# Patient Record
Sex: Male | Born: 1935 | Race: White | Hispanic: No | Marital: Married | Smoking: Former smoker
Health system: Southern US, Community
[De-identification: ages and names within clinical notes are randomized; demographics above are authoritative.]

## PROBLEM LIST (undated history)

## (undated) DIAGNOSIS — C801 Malignant (primary) neoplasm, unspecified: Secondary | ICD-10-CM

## (undated) DIAGNOSIS — K219 Gastro-esophageal reflux disease without esophagitis: Secondary | ICD-10-CM

## (undated) DIAGNOSIS — M1990 Unspecified osteoarthritis, unspecified site: Secondary | ICD-10-CM

## (undated) DIAGNOSIS — G629 Polyneuropathy, unspecified: Secondary | ICD-10-CM

## (undated) DIAGNOSIS — K5790 Diverticulosis of intestine, part unspecified, without perforation or abscess without bleeding: Secondary | ICD-10-CM

## (undated) DIAGNOSIS — E785 Hyperlipidemia, unspecified: Secondary | ICD-10-CM

## (undated) DIAGNOSIS — R351 Nocturia: Secondary | ICD-10-CM

## (undated) HISTORY — DX: Gastro-esophageal reflux disease without esophagitis: K21.9

## (undated) HISTORY — DX: Hyperlipidemia, unspecified: E78.5

## (undated) HISTORY — DX: Diverticulosis of intestine, part unspecified, without perforation or abscess without bleeding: K57.90

## (undated) HISTORY — PX: OTHER SURGICAL HISTORY: SHX169

## (undated) HISTORY — DX: Nocturia: R35.1

## (undated) HISTORY — DX: Polyneuropathy, unspecified: G62.9

## (undated) HISTORY — DX: Malignant (primary) neoplasm, unspecified: C80.1

## (undated) HISTORY — DX: Unspecified osteoarthritis, unspecified site: M19.90

---

## 2001-11-03 ENCOUNTER — Encounter: Payer: Self-pay | Admitting: *Deleted

## 2001-11-04 ENCOUNTER — Ambulatory Visit (HOSPITAL_COMMUNITY): Admission: RE | Admit: 2001-11-04 | Discharge: 2001-11-04 | Payer: Self-pay | Admitting: *Deleted

## 2001-11-04 ENCOUNTER — Encounter (INDEPENDENT_AMBULATORY_CARE_PROVIDER_SITE_OTHER): Payer: Self-pay | Admitting: Specialist

## 2001-11-10 ENCOUNTER — Encounter: Admission: RE | Admit: 2001-11-10 | Discharge: 2001-11-10 | Payer: Self-pay | Admitting: *Deleted

## 2001-11-10 ENCOUNTER — Encounter: Payer: Self-pay | Admitting: *Deleted

## 2002-08-28 ENCOUNTER — Ambulatory Visit (HOSPITAL_COMMUNITY): Admission: RE | Admit: 2002-08-28 | Discharge: 2002-08-28 | Payer: Self-pay | Admitting: *Deleted

## 2007-08-18 ENCOUNTER — Encounter: Admission: RE | Admit: 2007-08-18 | Discharge: 2007-08-18 | Payer: Self-pay | Admitting: Family Medicine

## 2008-04-12 ENCOUNTER — Encounter: Admission: RE | Admit: 2008-04-12 | Discharge: 2008-04-12 | Payer: Self-pay | Admitting: Family Medicine

## 2010-07-14 NOTE — Op Note (Signed)
NAMEQUIRINO, KAKOS                         ACCOUNT NO.:  [id number]   MEDICAL RECORD NO.:  [id number]                   PATIENT TYPE:  OIB   LOCATION:  2875                                 FACILITY:  [facility]   PHYSICIAN:  Balinda Quails, M.D.                 DATE OF BIRTH:  08/16/1935   DATE OF PROCEDURE:  11/04/2001  DATE OF DISCHARGE:                                 OPERATIVE REPORT   SURGEON:  Balinda Quails, M.D.   ASSISTANT:  Nurse.   ANESTHESIA:  General with LMA.   ANESTHESIOLOGIST:  Guadalupe Maple, M.D.   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   PROCEDURE:  Stripping ligation left greater saphenous vein.   CLINICAL NOTE:  This is a 75 year old male who has been followed in the  office with left greater saphenous incompetence and large varicose veins. He  recently presented with worsening symptoms of aching and heaviness in his  left leg. Doppler evaluation verified left greater saphenous incompetence,  also bilateral common femoral vein incompetence.   The patient was brought to the operating room at this time for stripping  ligation of the left greater saphenous vein for relief of symptoms.   DESCRIPTION OF PROCEDURE:  The patient brought to the operating room in  stable condition, placed in supine position.   The left thigh prepped and draped in a sterile fashion. General anesthesia  introduced with LMA.   An oblique skin incision made in the left groin over the saphenofemoral  junction. Dissection carried down to expose the saphenofemoral junction.  Tributaries of saphenous vein ligated with 3-0 silk and divided. The  saphenous vein followed up to the saphenofemoral junction and was ligated  with 2-0 silk.   A separate skin incision then made at a premarked site mid calf. Dissection  carried  down to expose the saphenous vein. The saphenous vein ligated  distally with 2-0 silk. The saphenous vein ligated and a stripper passed  along the course of the saphenous vein up to the groin. The saphenous vein  divided proximally. A stripper then used to remove the saphenous vein in  total from the mid calf to the saphenofemoral junction. Bleeding was  controlled with pressure.   A second skin incision was then made over a premarked site in the distal  calf where a large varicosity was present along with a perforator. The  perforator ligated with 2-0 silk. The vein stripped locally and ligated with  2-0 silk.   The incisions were then closed with running 3-0 Vicryl suture for  subcutaneous tissue interrupted 4-0 Vicryl for dermal layers. Half inch  Steri-Strips applied. A dressing of 4 x 4s, Kerlix, and Ace wrap applied.  The patient transferred to the recovery room in stable condition. No  apparent complications.                                                  Balinda Quails, M.D.    PGH/MEDQ  D:  11/04/2001  T:  11/05/2001  Job:  [id number]   cc:   Chales Salmon. Abigail Miyamoto, M.D.

## 2010-10-26 ENCOUNTER — Ambulatory Visit: Payer: Medicare Other | Attending: Family Medicine | Admitting: Physical Therapy

## 2010-10-26 DIAGNOSIS — M25569 Pain in unspecified knee: Secondary | ICD-10-CM | POA: Insufficient documentation

## 2010-10-26 DIAGNOSIS — IMO0001 Reserved for inherently not codable concepts without codable children: Secondary | ICD-10-CM | POA: Insufficient documentation

## 2010-10-26 DIAGNOSIS — M6281 Muscle weakness (generalized): Secondary | ICD-10-CM | POA: Insufficient documentation

## 2010-10-26 DIAGNOSIS — M25559 Pain in unspecified hip: Secondary | ICD-10-CM | POA: Insufficient documentation

## 2010-10-26 DIAGNOSIS — R5381 Other malaise: Secondary | ICD-10-CM | POA: Insufficient documentation

## 2010-11-06 ENCOUNTER — Ambulatory Visit: Payer: Medicare Other | Attending: Family Medicine

## 2010-11-06 DIAGNOSIS — IMO0001 Reserved for inherently not codable concepts without codable children: Secondary | ICD-10-CM | POA: Insufficient documentation

## 2010-11-06 DIAGNOSIS — R5381 Other malaise: Secondary | ICD-10-CM | POA: Insufficient documentation

## 2010-11-06 DIAGNOSIS — M25559 Pain in unspecified hip: Secondary | ICD-10-CM | POA: Insufficient documentation

## 2010-11-06 DIAGNOSIS — M6281 Muscle weakness (generalized): Secondary | ICD-10-CM | POA: Insufficient documentation

## 2010-11-06 DIAGNOSIS — M25569 Pain in unspecified knee: Secondary | ICD-10-CM | POA: Insufficient documentation

## 2010-11-10 ENCOUNTER — Ambulatory Visit: Payer: Medicare Other | Admitting: Physical Therapy

## 2010-11-14 ENCOUNTER — Encounter: Payer: Medicare Other | Admitting: Physical Therapy

## 2010-11-16 ENCOUNTER — Encounter: Payer: Medicare Other | Admitting: Physical Therapy

## 2011-11-12 ENCOUNTER — Other Ambulatory Visit: Payer: Self-pay | Admitting: Dermatology

## 2012-06-02 ENCOUNTER — Other Ambulatory Visit: Payer: Self-pay | Admitting: Dermatology

## 2012-08-27 HISTORY — PX: COLONOSCOPY: SHX174

## 2012-12-24 ENCOUNTER — Other Ambulatory Visit: Payer: Self-pay | Admitting: Gastroenterology

## 2013-01-21 ENCOUNTER — Other Ambulatory Visit: Payer: Self-pay | Admitting: Dermatology

## 2013-02-02 ENCOUNTER — Other Ambulatory Visit: Payer: Self-pay | Admitting: Dermatology

## 2014-03-04 ENCOUNTER — Encounter: Payer: Self-pay | Admitting: *Deleted

## 2014-05-05 ENCOUNTER — Encounter: Payer: Self-pay | Admitting: Podiatrist

## 2014-05-05 ENCOUNTER — Ambulatory Visit (INDEPENDENT_AMBULATORY_CARE_PROVIDER_SITE_OTHER): Payer: Medicare Other

## 2014-05-05 ENCOUNTER — Ambulatory Visit (INDEPENDENT_AMBULATORY_CARE_PROVIDER_SITE_OTHER): Payer: Medicare Other | Admitting: Podiatrist

## 2014-05-05 VITALS — BP 172/85 | HR 99 | Resp 12

## 2014-05-05 DIAGNOSIS — M7741 Metatarsalgia, right foot: Secondary | ICD-10-CM | POA: Diagnosis not present

## 2014-05-05 DIAGNOSIS — R52 Pain, unspecified: Secondary | ICD-10-CM

## 2014-05-05 DIAGNOSIS — M216X9 Other acquired deformities of unspecified foot: Secondary | ICD-10-CM | POA: Diagnosis not present

## 2014-05-05 DIAGNOSIS — M7742 Metatarsalgia, left foot: Secondary | ICD-10-CM

## 2014-05-05 NOTE — Progress Notes (Signed)
   Subjective:    Patient ID: Daryl Mitchell, male    DOB: 1935-02-27, 79 y.o.   MRN: [id number]  HPI  PT STATED B/L TOE ARE ACHING AND LT BALL OF THE FOOT/BUNION ARE HURTING FOR 5 YEARS. FOOT/TOES ARE GETTING WORSE ESPECIALLY LT FOOT IS SWOLLEN. THE FEET/TOES GET AGGRAVATED AT NIGHT/W[place]KNING. TRIED USING INSERTS AND PADDING AND IT HELP.  Review of Systems  HENT: Positive for hearing loss.   Musculoskeletal: Positive for joint swelling.  All other systems reviewed and are negative.      Objective:   Physical Exam  Patient is awake, alert, and oriented x 3.  In no acute distress.  Vascular status is intact with palpable pedal pulses at 2/4 DP and PT bilateral and capillary refill time within normal limits. Neurological sensation is also intact bilaterally via Semmes Weinstein monofilament at 5/5 sites. Light touch, vibratory sensation, Achilles tendon reflex is intact. Dermatological exam reveals skin color, turger and texture as normal. No open lesions present.  Musculature intact with dorsiflexion, plantarflexion, inversion, eversion.  Elongated 2nd digits bilateral are present and mildly bothersome.  Pain at the second metatarsal head plantarly is palpated.  Slight lateral deviation of bilateral halluces is noted.  Dorsal spurring of the first metatarsal head is noted mildly bilateral as well.  Crowding of the lesser digits is noted bilateral.    xrays show short first metattarsal with elongation of the second metatarsal and digit.  Lateral deviation of hallux noted bilateral. No sigh of acute boney abnormality noted..     Assessment & Plan:  Prominent 2nd metatarsal head with pain, metatarsalgia  Plan:  Discussed conservative therapies consisting of custom orthotics versus surgical options.  Patient will consider his options and let me know if he would like to proceed with a custom orthotic.

## 2014-05-05 NOTE — Patient Instructions (Signed)
Arthritis, Nonspecific °Arthritis is pain, redness, warmth, or puffiness (inflammation) of a joint. The joint may be stiff or hurt when you move it. One or more joints may be affected. There are many types of arthritis. Your doctor may not know what type you have right away. The most common cause of arthritis is wear and tear on the joint (osteoarthritis). °HOME CARE  °· Only take medicine as told by your doctor. °· Rest the joint as much as possible. °· Raise (elevate) your joint if it is puffy. °· Use crutches if the painful joint is in your leg. °· Drink enough fluids to keep your pee (urine) clear or pale yellow. °· Follow your doctor's diet instructions. °· Use cold packs for very bad joint pain for 10 to 15 minutes every hour. Ask your doctor if it is okay for you to use hot packs. °· Exercise as told by your doctor. °· Take a warm shower if you have stiffness in the morning. °· Move your sore joints throughout the day. °GET HELP RIGHT AWAY IF:  °· You have a fever. °· You have very bad joint pain, puffiness, or redness. °· You have many joints that are painful and puffy. °· You are not getting better with treatment. °· You have very bad back pain or leg weakness. °· You cannot control when you poop (bowel movement) or pee (urinate). °· You do not feel better in 24 hours or are getting worse. °· You are having side effects from your medicine. °MAKE SURE YOU:  °· Understand these instructions. °· Will watch your condition. °· Will get help right away if you are not doing well or get worse. °Document Released: 05/09/2009 Document Revised: 08/14/2011 Document Reviewed: 05/09/2009 °ExitCare® Patient Information ©2015 [employer]. This information is not intended to replace advice given to you by your health care provider. Make sure you discuss any questions you have with your health care provider. ° °

## 2015-03-14 ENCOUNTER — Ambulatory Visit (INDEPENDENT_AMBULATORY_CARE_PROVIDER_SITE_OTHER): Payer: Medicare Other | Admitting: Podiatry

## 2015-03-14 ENCOUNTER — Encounter: Payer: Self-pay | Admitting: Podiatry

## 2015-03-14 ENCOUNTER — Ambulatory Visit (INDEPENDENT_AMBULATORY_CARE_PROVIDER_SITE_OTHER): Payer: Medicare Other

## 2015-03-14 VITALS — Resp 16

## 2015-03-14 DIAGNOSIS — M79672 Pain in left foot: Secondary | ICD-10-CM

## 2015-03-14 DIAGNOSIS — M25571 Pain in right ankle and joints of right foot: Secondary | ICD-10-CM | POA: Diagnosis not present

## 2015-03-14 DIAGNOSIS — M779 Enthesopathy, unspecified: Secondary | ICD-10-CM | POA: Diagnosis not present

## 2015-03-14 MED ORDER — TRIAMCINOLONE ACETONIDE 10 MG/ML IJ SUSP
10.0000 mg | Freq: Once | INTRAMUSCULAR | Status: AC
  Administered 2015-03-14: 10 mg

## 2015-03-14 NOTE — Progress Notes (Signed)
Subjective:     Patient ID: Daryl Mitchell, male   DOB: 1935-10-30, 80 y.o.   MRN: [id number]  HPI patient presents stating I have had orthotics for a long time and I been trying to make modifications in them but it doesn't seem to be as effective and my ankle has really been bothering me my right foot   Review of Systems  All other systems reviewed and are negative.      Objective:   Physical Exam  Constitutional: He is oriented to person, place, and time.  Cardiovascular: Intact distal pulses.   Musculoskeletal: Normal range of motion.  Neurological: He is oriented to person, place, and time.  Skin: Skin is warm.  Nursing note and vitals reviewed.  neurovascular status intact muscle strength adequate range of motion within normal limits with patient noted to have inflammatory changes around the ankle joint right with pain in the sinus tarsi and mild mid arch pain right over left. Also noted limb length discrepancy with the right leg being approximately 1/4 inch shorter than the left and good digital perfusion and well oriented patient is noted     Assessment:     Inflammatory capsulitis sinus tarsi right with fluid buildup and mild fasciitis symptoms right with mechanical dysfunction    Plan:     H&P x-rays reviewed and today I injected the sinus tarsi right 3 mg Kenalog 5 mg Xylocaine and went ahead and scanned for custom orthotics with lift of 3/16  inch on the right side. Patient be seen back to recheck

## 2015-04-07 ENCOUNTER — Ambulatory Visit: Payer: Medicare Other | Admitting: *Deleted

## 2015-04-07 DIAGNOSIS — M779 Enthesopathy, unspecified: Secondary | ICD-10-CM

## 2015-04-07 NOTE — Patient Instructions (Signed)

## 2015-04-07 NOTE — Progress Notes (Signed)
Patient ID: Daryl Mitchell, male   DOB: [birth year], 80 y.o.   MRN: [id number] Patient presents for orthotic pick up.  Verbal and written break in and wear instructions given.  Patient will follow up in 4 weeks if symptoms worsen or fail to improve.

## 2015-04-15 ENCOUNTER — Ambulatory Visit: Payer: Medicare Other | Admitting: *Deleted

## 2015-04-15 DIAGNOSIS — M779 Enthesopathy, unspecified: Secondary | ICD-10-CM

## 2015-04-15 NOTE — Progress Notes (Signed)
Patient ID: Daryl Mitchell, male   DOB: December 24, 1935, 80 y.o.   MRN: [id number] Patient presents for orthotic adjustment. Patient states that it feels like his heel is "dropping off".  Patient pulled out store bought orthotics he had adjusted himself with stick on felt pads.  The adjustment the patient is requesting is an extreme change I am not comfortable making.  Reappoint with Dr Paulla Dolly for further discussion of appropriate adjustments.

## 2015-04-21 ENCOUNTER — Ambulatory Visit (INDEPENDENT_AMBULATORY_CARE_PROVIDER_SITE_OTHER): Payer: Medicare Other | Admitting: Podiatry

## 2015-04-21 ENCOUNTER — Encounter: Payer: Self-pay | Admitting: Podiatry

## 2015-04-21 DIAGNOSIS — M779 Enthesopathy, unspecified: Secondary | ICD-10-CM

## 2015-04-21 NOTE — Progress Notes (Signed)
Subjective:     Patient ID: Daryl Mitchell, male   DOB: Sep 28, 1935, 80 y.o.   MRN: [id number]  HPI patient presents stating that he wanted his orthotics looked at   Review of Systems     Objective:   Physical Exam Neurovascular status intact no other health history changes noted    Assessment:    Orthotics that need more support    Plan:     Send her orthotics back for redo

## 2015-05-09 ENCOUNTER — Ambulatory Visit: Payer: Medicare Other | Admitting: *Deleted

## 2015-05-09 DIAGNOSIS — R52 Pain, unspecified: Secondary | ICD-10-CM

## 2015-05-09 NOTE — Progress Notes (Signed)
Patient ID: Daryl Mitchell, male   DOB: June 13, 1935, 80 y.o.   MRN: [id number] Patient presents for adjusted orthotic pick up.  Verbal and written break in and wear instructions given.  Patient will follow up in 4 weeks if symptoms worsen or fail to improve.

## 2015-05-09 NOTE — Patient Instructions (Signed)

## 2015-05-24 ENCOUNTER — Ambulatory Visit: Payer: Medicare Other | Admitting: *Deleted

## 2015-05-24 DIAGNOSIS — R52 Pain, unspecified: Secondary | ICD-10-CM

## 2015-05-25 NOTE — Progress Notes (Signed)
Patient ID: Daryl Mitchell, male   DOB: 15-Apr-1935, 80 y.o.   MRN: [id number] Patient presents for continued adjustment on orthotics. Shaved additional height off of both wedges to increase comfort.

## 2019-01-16 ENCOUNTER — Other Ambulatory Visit: Payer: Self-pay

## 2019-01-16 ENCOUNTER — Encounter: Payer: Self-pay | Admitting: Physical Therapy

## 2019-01-16 ENCOUNTER — Ambulatory Visit: Payer: Medicare Other | Attending: Orthopedic Surgery | Admitting: Physical Therapy

## 2019-01-16 DIAGNOSIS — M545 Low back pain, unspecified: Secondary | ICD-10-CM

## 2019-01-16 DIAGNOSIS — R293 Abnormal posture: Secondary | ICD-10-CM | POA: Insufficient documentation

## 2019-01-16 DIAGNOSIS — M6281 Muscle weakness (generalized): Secondary | ICD-10-CM | POA: Diagnosis present

## 2019-01-16 NOTE — Therapy (Signed)
[facility] [address] [place], [place], [address] Phone: [phone]   Fax:  [phone]  Physical Therapy Evaluation  Patient Details  Name: Daryl Mitchell MRN: [id number] Date of Birth: Jul 31, 1935 Referring Provider (PT): Dr. Lajuana Matte   Encounter Date: 01/16/2019  PT End of Session - 01/16/19 0923    Visit Number  1    Date for PT Re-Evaluation  03/13/19    PT Start Time  0845    PT Stop Time  0923    PT Time Calculation (min)  38 min    Activity Tolerance  Patient tolerated treatment well;No increased pain    Behavior During Therapy  WFL for tasks assessed/performed       Past Medical History:  Diagnosis Date  . Arthritis   . Cancer ([place])    Basal and squamous cell carcinoma, Dr.Gruber  . Diverticulosis   . GERD (gastroesophageal reflux disease)   . Hyperlipidemia   . Nocturia   . Peripheral neuropathy     Past Surgical History:  Procedure Laterality Date  . COLONOSCOPY  08/27/2012  . skin cancer removal      There were no vitals filed for this visit.   Subjective Assessment - 01/16/19 0851    Subjective  3 months ago his back started to bother him. The pain wakes him up in the middle of the night. Patient has been doing walking and stretching 6 times per week.    Patient Stated Goals  reduce pain to sleep through the night    Currently in Pain?  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 Lajuana Matte    Prior Therapy  none      Precautions    Precautions  None      Restrictions   Weight Bearing Restrictions  No      Balance Screen   Has the patient fallen in the past 6 months  No    Has the patient had a decrease in activity level because of a fear of falling?   No    Is the patient reluctant to leave their home because of a fear of falling?   No      Home Film/video editor residence      Prior Function   Level of Independence  Independent    Vocation  Retired    Leisure  walks and stretches      Cognition   Overall Cognitive Status  Within Functional Limits for tasks assessed      Observation/Other Assessments   Focus on Therapeutic Outcomes (FOTO)   21% limitation; goal is 20% limitation      Posture/Postural Control   Posture/Postural Control  Postural limitations    Postural Limitations  Increased thoracic  kyphosis;Decreased lumbar lordosis;Posterior pelvic tilt      ROM / Strength   AROM / PROM / Strength  AROM;PROM;Strength      AROM   Lumbar Flexion  full    Lumbar Extension  decreased by 50%    Lumbar - Right Side Bend  decreased by 25%    Lumbar - Left Side Bend  decreased by 25%    Lumbar - Right Rotation  decreased 50%    Lumbar - Left Rotation  decreased by 50%      PROM   Right Hip External Rotation   40    Right Hip Internal Rotation   15    Left Hip External Rotation   35    Left Hip Internal Rotation   35      Strength   Right Hip Extension  4/5    Right Hip ABduction  3+/5    Left Hip Extension  4/5    Left Hip ABduction  3+/5      Palpation   Palpation comment  decreased movement of T3-L5                Objective measurements completed on examination: See above findings.              PT Education - 01/16/19 0922    Education Details  Access Code: [email or web address]    Person(s) Educated  Patient    Methods  Explanation;Demonstration;Verbal cues;Handout    Comprehension  Verbalized understanding;Returned demonstration       PT Short Term  Goals - 01/16/19 0928      PT SHORT TERM GOAL #1   Title  independent with initial HEP    Time  4    Period  Weeks    Status  New    Target Date  02/13/19      PT SHORT TERM GOAL #2   Title  able to sleep with pain decreased >/= 25%    Time  4    Period  Weeks    Status  New    Target Date  02/13/19      PT SHORT TERM GOAL #3   Title  understand ways to sleep to decrease strain on the spine    Time  4    Period  Weeks    Status  New    Target Date  02/13/19      PT SHORT TERM GOAL #4   Title  understand how to correct his posture to decrease strain on the spine    Time  4    Period  Weeks    Status  New    Target Date  02/13/19        PT Long Term Goals - 01/16/19 0929      PT LONG TERM GOAL #1   Title  independent with advanced HEP    Time  8    Period  Weeks    Status  New    Target Date  03/13/19      PT LONG TERM GOAL #2   Title  able to sleep through the night without pain    Time  8    Period  Weeks    Status  New    Target Date  03/13/19      PT LONG TERM GOAL #3   Title  able to walk with his normal speed and with minimal to no back discomfort    Time  8  Period  Weeks    Status  New    Target Date  03/13/19      PT LONG TERM GOAL #4   Title  FOTO score </= 20% limitation    Time  8    Period  Weeks    Status  New    Target Date  03/13/19             Plan - 01/16/19 [id number]    Clinical Impression Statement  Patient is a 83 year old male with lumbar pain when he sleeps at level 4/10 and started 4 months ago. Patient sleeps on his side and will wake up in the middle of the night due to pain. Patient hip strength for abduction and extension decreased. Patient has decreased lumbar ROM. Patient has difficulty with extending his lumbar spine. Patient posture consists of forward head, increased thoracic kyphosis, flat lumbar spine and posteriorly tilted spine. Patient will benefit from skilled therapy to reduce lumbar pain and improve posture.     Personal Factors and Comorbidities  Age    Examination-Activity Limitations  Locomotion Level;Sleep    Examination-Participation Restrictions  Community Activity    Stability/Clinical Decision Making  Stable/Uncomplicated    Clinical Decision Making  Low    Rehab Potential  Excellent    PT Frequency  1x / week    PT Duration  8 weeks    PT Treatment/Interventions  Cryotherapy;Electrical Stimulation;Moist Heat;Traction;Therapeutic exercise;Therapeutic activities;Functional mobility training;Neuromuscular re-education;Patient/family education;Dry needling;Manual techniques;Spinal Manipulations    PT Next Visit Plan  spinal mobilization to improve thoracic and lumbar extension, spinal stretches, elongation of the abdominals, back strength    PT Home Exercise Plan  Access Code: [email or web address]    Consulted and Agree with Plan of Care  Patient       Patient will benefit from skilled therapeutic intervention in order to improve the following deficits and impairments:  Decreased range of motion, Difficulty walking, Increased fascial restricitons, Pain, Impaired flexibility, Decreased strength, Postural dysfunction  Visit Diagnosis: Acute bilateral low back pain without sciatica  Muscle weakness (generalized)  Abnormal posture     Problem List There are no active problems to display for this patient.   Earlie Counts, PT 01/16/19 9:34 AM   [facility] [address], [place], [place], [address] Phone: [phone]   Fax:  [phone]  Name: Daryl Mitchell MRN: [id number] Date of Birth: 1936-02-23

## 2019-01-16 NOTE — Patient Instructions (Signed)
Access Code: [email or web address]  URL: [email or web address]  Date: 01/16/2019  Prepared by: Earlie Counts   Exercises Prone Press Up - 5 reps - 1 sets - 2 hold - 1x daily - 7x weekly Correct Seated Posture - 1 reps - 1 sets - 5 sec hold - 1x daily - 7x weekly Correct Standing Posture - 1 reps - 1 sets - 5 sec hold - 1x daily - 7x weekly Patient Education Forward [facility] [address] [place], [place] [address] Phone # [phone] Fax [phone]

## 2019-01-20 ENCOUNTER — Other Ambulatory Visit: Payer: Self-pay

## 2019-01-20 ENCOUNTER — Ambulatory Visit: Payer: Medicare Other | Admitting: Physical Therapy

## 2019-01-20 ENCOUNTER — Encounter: Payer: Self-pay | Admitting: Physical Therapy

## 2019-01-20 DIAGNOSIS — M545 Low back pain, unspecified: Secondary | ICD-10-CM

## 2019-01-20 DIAGNOSIS — R293 Abnormal posture: Secondary | ICD-10-CM

## 2019-01-20 DIAGNOSIS — M6281 Muscle weakness (generalized): Secondary | ICD-10-CM

## 2019-01-20 NOTE — Therapy (Signed)
[facility] [address] [place], [place], [address] Phone: [phone]   Fax:  [phone]  Physical Therapy Treatment  Patient Details  Name: JOURDON LAFLASH MRN: [id number] Date of Birth: February 22, 1936 Referring Provider (PT): Dr. Lajuana Matte   Encounter Date: 01/20/2019  PT End of Session - 01/20/19 1344    Visit Number  2    Date for PT Re-Evaluation  03/13/19    PT Start Time  1232    PT Stop Time  1314    PT Time Calculation (min)  42 min    Activity Tolerance  Patient tolerated treatment well;No increased pain    Behavior During Therapy  WFL for tasks assessed/performed       Past Medical History:  Diagnosis Date  . Arthritis   . Cancer ([place])    Basal and squamous cell carcinoma, Dr.Gruber  . Diverticulosis   . GERD (gastroesophageal reflux disease)   . Hyperlipidemia   . Nocturia   . Peripheral neuropathy     Past Surgical History:  Procedure Laterality Date  . COLONOSCOPY  08/27/2012  . skin cancer removal      There were no vitals filed for this visit.  Subjective Assessment - 01/20/19 1235    Subjective  Pt states that things are going well. No pain currently. He did have some pain last night while sleeping.    Patient Stated Goals  reduce pain to sleep through the night    Currently in Pain?  No/denies    Pain Onset  More than a month ago                       [facility] Adult PT Treatment/Exercise - 01/20/19 0001      Exercises   Exercises  Lumbar      Lumbar Exercises: Stretches   Piriformis Stretch  2 reps;Left;Right;30 seconds    Piriformis Stretch Limitations  seated       Lumbar Exercises: Aerobic   Nustep  L1 x4 min, PT present to discuss progress and HEP      Lumbar Exercises: Seated   Other Seated Lumbar Exercises  upper lumbar extension over half foam roll x5 reps upward to thoracic spine, PT assisting extension x5 reps in mid thoracic spine       Lumbar  Exercises: Supine   Other Supine Lumbar Exercises  hooklying low trunk rotation x10 reps each      Manual Therapy   Manual Therapy  Passive ROM    Passive ROM  Active assisted stretch with supine low trunk rotation x5 reps each, seated thoracic rotation mobilization with movement x10 reps Lt and Rt              PT Education - 01/20/19 1344    Education Details  updated HEP    Person(s) Educated  Patient    Methods  Explanation;Handout    Comprehension  Verbalized understanding       PT Short Term Goals - 01/16/19 0928      PT SHORT TERM GOAL #1   Title  independent with initial HEP    Time  4    Period  Weeks    Status  New    Target Date  02/13/19      PT SHORT TERM GOAL #2   Title  able to sleep with pain decreased >/= 25%    Time  4    Period  Weeks  Status  New    Target Date  02/13/19      PT SHORT TERM GOAL #3   Title  understand ways to sleep to decrease strain on the spine    Time  4    Period  Weeks    Status  New    Target Date  02/13/19      PT SHORT TERM GOAL #4   Title  understand how to correct his posture to decrease strain on the spine    Time  4    Period  Weeks    Status  New    Target Date  02/13/19        PT Long Term Goals - 01/16/19 0929      PT LONG TERM GOAL #1   Title  independent with advanced HEP    Time  8    Period  Weeks    Status  New    Target Date  03/13/19      PT LONG TERM GOAL #2   Title  able to sleep through the night without pain    Time  8    Period  Weeks    Status  New    Target Date  03/13/19      PT LONG TERM GOAL #3   Title  able to walk with his normal speed and with minimal to no back discomfort    Time  8    Period  Weeks    Status  New    Target Date  03/13/19      PT LONG TERM GOAL #4   Title  FOTO score </= 20% limitation    Time  8    Period  Weeks    Status  New    Target Date  03/13/19            Plan - 01/20/19 1345    Clinical Impression Statement  Pt has had no  issues with his HEP since the evaluation. Today focused on therex and manual techniques to improve both thoracic and lumbar mobility. Pt requires therapist cuing to improve his understanding of several new exercises, but he denied any pain with these. Pt's posture awareness has greatly improved from his evaluation, but he would continue to benefit from skilled PT to address mobility and strength deficits to increase his ability to self-correct posture moving forward.    Personal Factors and Comorbidities  Age    Examination-Activity Limitations  Locomotion Level;Sleep    Examination-Participation Restrictions  Community Activity    Stability/Clinical Decision Making  Stable/Uncomplicated    Rehab Potential  Excellent    PT Frequency  1x / week    PT Duration  8 weeks    PT Treatment/Interventions  Cryotherapy;Electrical Stimulation;Moist Heat;Traction;Therapeutic exercise;Therapeutic activities;Functional mobility training;Neuromuscular re-education;Patient/family education;Dry needling;Manual techniques;Spinal Manipulations    PT Next Visit Plan  spinal mobilization to improve thoracic and lumbar extension, spinal stretches, elongation of the abdominals, back strength    PT Home Exercise Plan  Access Code: [email or web address]    Consulted and Agree with Plan of Care  Patient       Patient will benefit from skilled therapeutic intervention in order to improve the following deficits and impairments:  Decreased range of motion, Difficulty walking, Increased fascial restricitons, Pain, Impaired flexibility, Decreased strength, Postural dysfunction  Visit Diagnosis: Acute bilateral low back pain without sciatica  Muscle weakness (generalized)  Abnormal posture     Problem List There are no active problems  to display for this patient.   2:00 PM,01/20/19 Juniper Cobey PT, [place] [facility] [place]  [facility] [address], [place], [place], [address] Phone: [phone]   Fax:  [phone]  Name: KESLEY KEGG MRN: [id number] Date of Birth: Jul 01, 1935

## 2019-01-20 NOTE — Patient Instructions (Signed)
Access Code: [email or web address]  URL: [email or web address]  Date: 01/20/2019  Prepared by: Sherol Dade   Exercises  Prone Press Up - 5 reps - 1 sets - 2 hold - 1x daily - 7x weekly  Correct Seated Posture - 1 reps - 1 sets - 5 sec hold - 1x daily - 7x weekly  Correct Standing Posture - 1 reps - 1 sets - 5 sec hold - 1x daily - 7x weekly  Supine Lower Trunk Rotation - 15 reps - 2x daily - 7x weekly  Seated Thoracic Lumbar Extension - 10 reps - 3 sets - 1x daily - 7x weekly  Patient Education  [facility] [address] [place], [place] [address] Phone # [phone] Fax [phone]

## 2019-01-29 ENCOUNTER — Ambulatory Visit: Payer: Medicare Other | Attending: Orthopedic Surgery | Admitting: Physical Therapy

## 2019-01-29 ENCOUNTER — Encounter: Payer: Self-pay | Admitting: Physical Therapy

## 2019-01-29 ENCOUNTER — Other Ambulatory Visit: Payer: Self-pay

## 2019-01-29 DIAGNOSIS — M545 Low back pain, unspecified: Secondary | ICD-10-CM

## 2019-01-29 DIAGNOSIS — M6281 Muscle weakness (generalized): Secondary | ICD-10-CM | POA: Diagnosis present

## 2019-01-29 DIAGNOSIS — R293 Abnormal posture: Secondary | ICD-10-CM | POA: Insufficient documentation

## 2019-01-29 NOTE — Patient Instructions (Signed)
Access Code: [email or web address]  URL: [email or web address]  Date: 01/29/2019  Prepared by: Sherol Dade   Exercises  Prone Press Up - 5 reps - 1 sets - 2 hold - 1x daily - 7x weekly  Correct Seated Posture - 1 reps - 1 sets - 5 sec hold - 1x daily - 7x weekly  Correct Standing Posture - 1 reps - 1 sets - 5 sec hold - 1x daily - 7x weekly  Supine Lower Trunk Rotation - 15 reps - 2x daily - 7x weekly  Seated Thoracic Lumbar Extension - 10 reps - 3 sets - 2x daily - 7x weekly  Standing Sidebends - 10 reps - 3 sec hold - 2x daily - 7x weekly  Patient Education  [facility] [address], [place], [place] [address] Phone # [phone] Fax [phone]

## 2019-01-29 NOTE — Therapy (Signed)
[facility] [address] [place], [place], [address] Phone: [phone]   Fax:  [phone]  Physical Therapy Treatment  Patient Details  Name: Daryl Mitchell MRN: [id number] Date of Birth: 30-Jan-1936 Referring Provider (PT): Dr. Lajuana Matte   Encounter Date: 01/29/2019  PT End of Session - 01/29/19 1234    Visit Number  3    Date for PT Re-Evaluation  03/13/19    PT Start Time  1232    PT Stop Time  1311    PT Time Calculation (min)  39 min    Activity Tolerance  Patient tolerated treatment well;No increased pain    Behavior During Therapy  WFL for tasks assessed/performed       Past Medical History:  Diagnosis Date  . Arthritis   . Cancer ([place])    Basal and squamous cell carcinoma, Dr.Gruber  . Diverticulosis   . GERD (gastroesophageal reflux disease)   . Hyperlipidemia   . Nocturia   . Peripheral neuropathy     Past Surgical History:  Procedure Laterality Date  . COLONOSCOPY  08/27/2012  . skin cancer removal      There were no vitals filed for this visit.  Subjective Assessment - 01/29/19 1233    Subjective  Pt states that he is still having pain in the middle of the night. He is waking up at around 5am and having trouble getting back to sleep.    Patient Stated Goals  reduce pain to sleep through the night    Currently in Pain?  No/denies    Pain Onset  More than a month ago                       [facility] Adult PT Treatment/Exercise - 01/29/19 0001      Lumbar Exercises: Stretches   Sports administrator  Left;Right;2 reps;30 seconds    Quad Stretch Limitations  standing with LE in chair     Piriformis Stretch  1 rep;Left;Right;30 seconds    Piriformis Stretch Limitations  seated    Other Lumbar Stretch Exercise  chest stretch in doorway 2x20 sec hold       Lumbar Exercises: Seated   Other Seated Lumbar Exercises  lateral lumbar flexion with UE reach x10 reps each side    Other  Seated Lumbar Exercises  low thoracic to mid lumbar spine extension with strap x10 reps each       Lumbar Exercises: Supine   Other Supine Lumbar Exercises  bilateral knees to chest and trunk rotation x10 reps each, LE on red physioball       Manual Therapy   Manual therapy comments  seated mid thoracic extension MWM x10 reps each segment; CPAs grade III-IV x2 bouts L3 to T11             PT Education - 01/29/19 1248    Education Details  techinque with therex; updates to HEP    Person(s) Educated  Patient    Methods  Explanation;Handout;Verbal cues    Comprehension  Verbalized understanding;Returned demonstration       PT Short Term Goals - 01/16/19 0928      PT SHORT TERM GOAL #1   Title  independent with initial HEP    Time  4    Period  Weeks    Status  New    Target Date  02/13/19      PT SHORT TERM GOAL #2  Title  able to sleep with pain decreased >/= 25%    Time  4    Period  Weeks    Status  New    Target Date  02/13/19      PT SHORT TERM GOAL #3   Title  understand ways to sleep to decrease strain on the spine    Time  4    Period  Weeks    Status  New    Target Date  02/13/19      PT SHORT TERM GOAL #4   Title  understand how to correct his posture to decrease strain on the spine    Time  4    Period  Weeks    Status  New    Target Date  02/13/19        PT Long Term Goals - 01/16/19 0929      PT LONG TERM GOAL #1   Title  independent with advanced HEP    Time  8    Period  Weeks    Status  New    Target Date  03/13/19      PT LONG TERM GOAL #2   Title  able to sleep through the night without pain    Time  8    Period  Weeks    Status  New    Target Date  03/13/19      PT LONG TERM GOAL #3   Title  able to walk with his normal speed and with minimal to no back discomfort    Time  8    Period  Weeks    Status  New    Target Date  03/13/19      PT LONG TERM GOAL #4   Title  FOTO score </= 20% limitation    Time  8    Period   Weeks    Status  New    Target Date  03/13/19            Plan - 01/29/19 1312    Clinical Impression Statement  Pt is working on his HEP and feels that his mobility is improving throughout the day. Session focused on therex and manual techniques to increase lumbar and thoracic mobility. Several additional stretches were introduced, and pt required PT cuing to improve his understanding/technique with these. No pain was reported during or following today's session. Will continue to address spinal segment hypomobility and flexibility moving forward.    Personal Factors and Comorbidities  Age    Examination-Activity Limitations  Locomotion Level;Sleep    Examination-Participation Restrictions  Community Activity    Stability/Clinical Decision Making  Stable/Uncomplicated    Rehab Potential  Excellent    PT Frequency  1x / week    PT Duration  8 weeks    PT Treatment/Interventions  Cryotherapy;Electrical Stimulation;Moist Heat;Traction;Therapeutic exercise;Therapeutic activities;Functional mobility training;Neuromuscular re-education;Patient/family education;Dry needling;Manual techniques;Spinal Manipulations    PT Next Visit Plan  spinal mobilization to improve thoracic and lumbar extension, spinal stretches, elongation of the abdominals, back strength    PT Home Exercise Plan  Access Code: [email or web address]    Consulted and Agree with Plan of Care  Patient       Patient will benefit from skilled therapeutic intervention in order to improve the following deficits and impairments:  Decreased range of motion, Difficulty walking, Increased fascial restricitons, Pain, Impaired flexibility, Decreased strength, Postural dysfunction  Visit Diagnosis: Acute bilateral low back pain without sciatica  Muscle weakness (generalized)  Abnormal posture     Problem List There are no active problems to display for this patient.   1:18 PM,01/29/19 Sherol Dade PT, [place] [facility] [place]  [facility] [address] [place], [place], [address] Phone: [phone]   Fax:  [phone]  Name: Daryl Mitchell MRN: [id number] Date of Birth: 11-24-1935

## 2019-02-05 ENCOUNTER — Ambulatory Visit: Payer: Medicare Other | Admitting: Physical Therapy

## 2019-02-05 ENCOUNTER — Other Ambulatory Visit: Payer: Self-pay

## 2019-02-05 ENCOUNTER — Encounter: Payer: Self-pay | Admitting: Physical Therapy

## 2019-02-05 DIAGNOSIS — M545 Low back pain, unspecified: Secondary | ICD-10-CM

## 2019-02-05 DIAGNOSIS — M6281 Muscle weakness (generalized): Secondary | ICD-10-CM

## 2019-02-05 DIAGNOSIS — R293 Abnormal posture: Secondary | ICD-10-CM

## 2019-02-05 NOTE — Therapy (Signed)
[facility] [address] [place], [place], [address] Phone: [phone]   Fax:  [phone]  Physical Therapy Treatment  Patient Details  Name: Daryl Mitchell MRN: [id number] Date of Birth: [birth year] Referring Provider (PT): Dr. Lajuana Matte   Encounter Date: 02/05/2019  PT End of Session - 02/05/19 1447    Visit Number  4    Date for PT Re-Evaluation  03/13/19    PT Start Time  1400    PT Stop Time  5885    PT Time Calculation (min)  39 min    Activity Tolerance  Patient tolerated treatment well;No increased pain    Behavior During Therapy  WFL for tasks assessed/performed       Past Medical History:  Diagnosis Date  . Arthritis   . Cancer ([place])    Basal and squamous cell carcinoma, Dr.Gruber  . Diverticulosis   . GERD (gastroesophageal reflux disease)   . Hyperlipidemia   . Nocturia   . Peripheral neuropathy     Past Surgical History:  Procedure Laterality Date  . COLONOSCOPY  08/27/2012  . skin cancer removal      There were no vitals filed for this visit.  Subjective Assessment - 02/05/19 1405    Subjective  Pt states that things are going well. He is still waking up with back pain but it is not nearly as bad, nearly 20% improved.    Patient Stated Goals  reduce pain to sleep through the night    Currently in Pain?  No/denies    Pain Onset  More than a month ago                       [facility] Adult PT Treatment/Exercise - 02/05/19 0001      Self-Care   Self-Care  Posture    Posture  sleeping positions with pillow between knees; discussed ways to complete HEP in bed if he wakes up with pain and attempting to go back to sleep rather than getting up       Lumbar Exercises: Stretches   Other Lumbar Stretch Exercise  hip adductor butterfly stretch 5x10 sec hold     Other Lumbar Stretch Exercise  pec stretch in doorway x20sec; Lt and Rt latissimus stretch in doorway 2x15sec each        Lumbar Exercises: Seated   Other Seated Lumbar Exercises  lumbar extension over foam with BUE reach overhead 2x15 reps     Other Seated Lumbar Exercises  thoracic rotation stretch x10 reps each direction       Lumbar Exercises: Sidelying   Clam  Both;10 reps    Clam Limitations  1st set without resistance, 2nd set with yellow TB      Lumbar Exercises: Prone   Other Prone Lumbar Exercises  press up on elbows       Manual Therapy   Manual therapy comments  Lateral trunk flexion stretch with PT overpressure and blocking pelvis x10 reps each     Passive ROM  prone quadriceps stretch 3x20 sec each; supine 90/90 hamstring stretch 4x20 sec each              PT Education - 02/05/19 1446    Education Details  self care; technique with therex    Person(s) Educated  Patient    Methods  Explanation;Verbal cues    Comprehension  Verbalized understanding;Returned demonstration       PT Short Term  Goals - 02/05/19 1422      PT SHORT TERM GOAL #1   Title  independent with initial HEP    Time  4    Period  Weeks    Status  Achieved    Target Date  02/13/19      PT SHORT TERM GOAL #2   Title  able to sleep with pain decreased >/= 25%    Baseline  20%    Time  4    Period  Weeks    Status  Partially Met    Target Date  02/13/19      PT SHORT TERM GOAL #3   Title  understand ways to sleep to decrease strain on the spine    Time  4    Period  Weeks    Status  Achieved    Target Date  02/13/19      PT SHORT TERM GOAL #4   Title  understand how to correct his posture to decrease strain on the spine    Time  4    Period  Weeks    Status  New    Target Date  02/13/19        PT Long Term Goals - 01/16/19 0929      PT LONG TERM GOAL #1   Title  independent with advanced HEP    Time  8    Period  Weeks    Status  New    Target Date  03/13/19      PT LONG TERM GOAL #2   Title  able to sleep through the night without pain    Time  8    Period  Weeks    Status   New    Target Date  03/13/19      PT LONG TERM GOAL #3   Title  able to walk with his normal speed and with minimal to no back discomfort    Time  8    Period  Weeks    Status  New    Target Date  03/13/19      PT LONG TERM GOAL #4   Title  FOTO score </= 20% limitation    Time  8    Period  Weeks    Status  New    Target Date  03/13/19            Plan - 02/05/19 1443    Clinical Impression Statement  Pt is making progress towards his goals, noting atleast 20% improvement in his pain when sleeping at night. Continues to complete his HEP consistently without reported difficulty. PT discussed ways to incorporate his HEP during the night when sleep is disturbed by pain and educated him on sleeping position adjustments moving forward. Pt verbalized understanding of this. No pain was reported end of session. Will continue with current POC and follow up with pt's sleeping over the weekend after he makes the adjustments reviewed during today's session.    Personal Factors and Comorbidities  Age    Examination-Activity Limitations  Locomotion Level;Sleep    Examination-Participation Restrictions  Community Activity    Stability/Clinical Decision Making  Stable/Uncomplicated    Rehab Potential  Excellent    PT Frequency  1x / week    PT Duration  8 weeks    PT Treatment/Interventions  Cryotherapy;Electrical Stimulation;Moist Heat;Traction;Therapeutic exercise;Therapeutic activities;Functional mobility training;Neuromuscular re-education;Patient/family education;Dry needling;Manual techniques;Spinal Manipulations    PT Next Visit Plan  f/u on sleeping; spinal mobilization  to improve thoracic and lumbar extension, spinal stretches, elongation of the abdominals, back strength    PT Home Exercise Plan  Access Code: [email or web address]    Consulted and Agree with Plan of Care  Patient       Patient will benefit from skilled therapeutic intervention in order to improve the following deficits and  impairments:  Decreased range of motion, Difficulty walking, Increased fascial restricitons, Pain, Impaired flexibility, Decreased strength, Postural dysfunction  Visit Diagnosis: Acute bilateral low back pain without sciatica  Muscle weakness (generalized)  Abnormal posture     Problem List There are no problems to display for this patient.   2:48 PM,02/05/19 Sherol Dade PT, DPT [place] [facility] [place] [facility] [address] [place], [place], [address] Phone: [phone]   Fax:  [phone]  Name: Daryl Mitchell MRN: [id number] Date of Birth: 23-Jan-1936

## 2019-02-12 ENCOUNTER — Ambulatory Visit: Payer: Medicare Other | Admitting: Physical Therapy

## 2019-02-12 ENCOUNTER — Other Ambulatory Visit: Payer: Self-pay

## 2019-02-12 ENCOUNTER — Encounter: Payer: Self-pay | Admitting: Physical Therapy

## 2019-02-12 DIAGNOSIS — M6281 Muscle weakness (generalized): Secondary | ICD-10-CM

## 2019-02-12 DIAGNOSIS — M545 Low back pain, unspecified: Secondary | ICD-10-CM

## 2019-02-12 DIAGNOSIS — R293 Abnormal posture: Secondary | ICD-10-CM

## 2019-02-12 NOTE — Therapy (Signed)
[facility] [address], [place], [place], [address] Phone: [phone]   Fax:  [phone]  Physical Therapy Treatment  Patient Details  Name: Daryl Mitchell MRN: [id number] Date of Birth: 16-Dec-1935 Referring Provider (PT): Dr. Lajuana Matte   Encounter Date: 02/12/2019  PT End of Session - 02/12/19 1442    Visit Number  5    Date for PT Re-Evaluation  03/13/19    Authorization Type  [employer] medicare    PT Start Time  1400    PT Stop Time  1440    PT Time Calculation (min)  40 min    Activity Tolerance  Patient tolerated treatment well;No increased pain    Behavior During Therapy  WFL for tasks assessed/performed       Past Medical History:  Diagnosis Date  . Arthritis   . Cancer ([place])    Basal and squamous cell carcinoma, Dr.Gruber  . Diverticulosis   . GERD (gastroesophageal reflux disease)   . Hyperlipidemia   . Nocturia   . Peripheral neuropathy     Past Surgical History:  Procedure Laterality Date  . COLONOSCOPY  08/27/2012  . skin cancer removal      There were no vitals filed for this visit.                    [place] Adult PT Treatment/Exercise - 02/12/19 0001      Therapeutic Activites    Therapeutic Activities  Other Therapeutic Activities    Other Therapeutic Activities  reviewed sitting and standing posture to reduce flexed spine      Lumbar Exercises: Standing   Other Standing Lumbar Exercises  facing wall bilateral shoulder flexion 10x, facing wall bilateral shoulder scaption with scapula squeeze 10x    Other Standing Lumbar Exercises  standing lumbar extension 10x      Lumbar Exercises: Prone   Single Arm Raise  Right;Left;10 reps;1 second    Single Arm Raise Weights (lbs)  with therapist help due to tightness    Straight Leg Raise  10 reps   right, left, with pillow   Straight Leg Raises Limitations  assistance on lifting      Shoulder Exercises: Prone    Extension  Both;15 reps    Horizontal ABduction 1  Strengthening;Right;Left;10 reps      Manual Therapy   Manual Therapy  Joint mobilization    Joint Mobilization  P-A and rotational mobilization to T3-L5 for extension    Passive ROM  prone quadriceps stretch 3x20 sec each; supine 90/90 hamstring stretch 4x20 sec each              PT Education - 02/12/19 1429    Education Details  Access Code: [email or web address]    Person(s) Educated  Patient    Methods  Explanation;Demonstration;Verbal cues;Handout    Comprehension  Verbalized understanding;Returned demonstration       PT Short Term Goals - 02/12/19 1446      PT SHORT TERM GOAL #2   Title  able to sleep with pain decreased >/= 25%    Time  4    Period  Weeks    Status  Achieved      PT SHORT TERM GOAL #4   Title  understand how to correct his posture to decrease strain on the spine    Time  4    Period  Weeks    Status  Achieved  PT Long Term Goals - 01/16/19 0929      PT LONG TERM GOAL #1   Title  independent with advanced HEP    Time  8    Period  Weeks    Status  New    Target Date  03/13/19      PT LONG TERM GOAL #2   Title  able to sleep through the night without pain    Time  8    Period  Weeks    Status  New    Target Date  03/13/19      PT LONG TERM GOAL #3   Title  able to walk with his normal speed and with minimal to no back discomfort    Time  8    Period  Weeks    Status  New    Target Date  03/13/19      PT LONG TERM GOAL #4   Title  FOTO score </= 20% limitation    Time  8    Period  Weeks    Status  New    Target Date  03/13/19            Plan - 02/12/19 1422    Clinical Impression Statement  Patient reports 50% better. Patient is able to sleep better. Patient came in with better upright posture. Patient is not able to do bilaterall shoulder fleixon and horizontal abduction so he is doing it in standing. Patient will benefit from skilled therapy to improve postural and back  strength to reduce his pain.    Personal Factors and Comorbidities  Age;Behavior Pattern    Examination-Activity Limitations  Locomotion Level;Sleep    Examination-Participation Restrictions  Community Activity    Stability/Clinical Decision Making  Stable/Uncomplicated    Rehab Potential  Excellent    PT Frequency  1x / week    PT Duration  8 weeks    PT Treatment/Interventions  Cryotherapy;Electrical Stimulation;Moist Heat;Traction;Therapeutic exercise;Therapeutic activities;Functional mobility training;Neuromuscular re-education;Patient/family education;Dry needling;Manual techniques;Spinal Manipulations    PT Next Visit Plan  f/u on sleeping; spinal mobilization to improve thoracic and lumbar extension, spinal stretches, elongation of the abdominals, back strength; possible last visit    PT Home Exercise Plan  Access Code: [email or web address]    Recommended Other Services  MD signed initial eval    Consulted and Agree with Plan of Care  Patient       Patient will benefit from skilled therapeutic intervention in order to improve the following deficits and impairments:  Decreased range of motion, Difficulty walking, Increased fascial restricitons, Pain, Impaired flexibility, Decreased strength, Postural dysfunction  Visit Diagnosis: Acute bilateral low back pain without sciatica  Muscle weakness (generalized)  Abnormal posture     Problem List There are no problems to display for this patient.   Earlie Counts, PT 02/12/19 2:49 PM   [facility] [address], [place], [place], [address] Phone: [phone]   Fax:  [phone]  Name: Daryl Mitchell MRN: [id number] Date of Birth: 1935/06/14

## 2019-02-12 NOTE — Patient Instructions (Signed)
Access Code: [email or web address]  URL: [email or web address]  Date: 02/12/2019  Prepared by: Earlie Counts   Exercises Prone Press Up - 5 reps - 1 sets - 2 hold - 1x daily - 7x weekly Correct Seated Posture - 1 reps - 1 sets - 5 sec hold - 1x daily - 7x weekly Correct Standing Posture - 1 reps - 1 sets - 5 sec hold - 1x daily - 7x weekly Supine Lower Trunk Rotation - 15 reps - 2x daily - 7x weekly Seated Thoracic Lumbar Extension - 10 reps - 3 sets - 2x daily - 7x weekly Standing Sidebends - 10 reps - 3 sec hold - 2x daily - 7x weekly Prone Shoulder Extension - 10 reps - 1 sets - 1 sec hold - 1x daily - 7x weekly Standing shoulder flexion wall slides - 10 reps - 1 sets - 1 sec hold - 1x daily - 7x weekly Standing Bilateral Shoulder Scaption Wall Slide - 10 reps - 1 sets - 1x daily - 7x weekly Patient Education [facility] [address] [place], [place] [address] Phone # [phone] Fax [phone]

## 2019-02-26 ENCOUNTER — Other Ambulatory Visit: Payer: Self-pay

## 2019-02-26 ENCOUNTER — Ambulatory Visit: Payer: Medicare Other | Admitting: Physical Therapy

## 2019-02-26 DIAGNOSIS — M545 Low back pain, unspecified: Secondary | ICD-10-CM

## 2019-02-26 DIAGNOSIS — R293 Abnormal posture: Secondary | ICD-10-CM

## 2019-02-26 DIAGNOSIS — M6281 Muscle weakness (generalized): Secondary | ICD-10-CM

## 2019-02-26 NOTE — Therapy (Signed)
[facility] [address], [place], [place], [address] Phone: [phone]   Fax:  [phone]  Physical Therapy Treatment  Patient Details  Name: Daryl Mitchell MRN: [id number] Date of Birth: [birth year] Referring Provider (PT): Dr. Lajuana Matte   Encounter Date: 02/26/2019  PT End of Session - 02/26/19 1311    Visit Number  6    Date for PT Re-Evaluation  03/13/19    Authorization Type  [employer] medicare    PT Start Time  1230    PT Stop Time  1310    PT Time Calculation (min)  40 min    Activity Tolerance  Patient tolerated treatment well;No increased pain    Behavior During Therapy  WFL for tasks assessed/performed       Past Medical History:  Diagnosis Date  . Arthritis   . Cancer ([place])    Basal and squamous cell carcinoma, Dr.Gruber  . Diverticulosis   . GERD (gastroesophageal reflux disease)   . Hyperlipidemia   . Nocturia   . Peripheral neuropathy     Past Surgical History:  Procedure Laterality Date  . COLONOSCOPY  08/27/2012  . skin cancer removal      There were no vitals filed for this visit.  Subjective Assessment - 02/26/19 1233    Subjective  The back is better. Building the changes in my life style helps.    Patient Stated Goals  reduce pain to sleep through the night    Currently in Pain?  No/denies         [facility] PT Assessment - 02/26/19 0001      Assessment   Medical Diagnosis  M47.816 Arthritis of lumbar spine    Referring Provider (PT)  Dr. Lajuana Matte    Prior Therapy  none      Precautions   Precautions  None      Restrictions   Weight Bearing Restrictions  No      Home Environment   Living Environment  Private residence      Prior Function   Level of [place]  Retired    Leisure  walks and stretches      Cognition   Overall Cognitive Status  Within Functional Limits for tasks assessed      Observation/Other Assessments   Focus  on Therapeutic Outcomes (FOTO)   30% limitation      Posture/Postural Control   Posture/Postural Control  Postural limitations    Postural Limitations  Increased thoracic kyphosis;Decreased lumbar lordosis;Posterior pelvic tilt      AROM   Lumbar Extension  decreased by 25%    Lumbar - Right Side Bend  decreased by 25%    Lumbar - Left Side Bend  decreased by 25%    Lumbar - Right Rotation  full    Lumbar - Left Rotation  full      PROM   Right Hip External Rotation   40    Right Hip Internal Rotation   20    Left Hip External Rotation   45    Left Hip Internal Rotation   35      Strength   Right Hip Extension  4+/5    Right Hip ABduction  4+/5    Left Hip Extension  4/5    Left Hip ABduction  4-/5        Access Code: [email or web address]  URL: [email or web address]  Date: 02/26/2019  Prepared by: Malachy Mood  [facility]   Exercises [employer] Up - 5 reps - 1 sets - 2 hold - 1x daily - 7x weekly Correct Seated Posture - 1 reps - 1 sets - 5 sec hold - 1x daily - 7x weekly Correct Standing Posture - 1 reps - 1 sets - 5 sec hold - 1x daily - 7x weekly Supine Lower Trunk Rotation - 15 reps - 2x daily - 7x weekly Seated Thoracic Lumbar Extension - 10 reps - 3 sets - 2x daily - 7x weekly Standing Sidebends - 10 reps - 3 sec hold - 2x daily - 7x weekly Prone Shoulder Extension - 10 reps - 1 sets - 1 sec hold - 1x daily - 7x weekly Standing shoulder flexion wall slides - 10 reps - 1 sets - 1 sec hold - 1x daily - 7x weekly Standing Bilateral Shoulder Scaption Wall Slide - 10 reps - 1 sets - 1x daily - 7x weekly Scapular Retraction with Resistance - 10 reps - 3 sets - 1x daily - 7x weekly Shoulder Extension with Resistance - 10 reps - 2 sets - 1x daily - 7x weekly Standing Hip Extension Kicks - 10 reps - 1 sets - 1x daily - 7x weekly Standing Hip Abduction with Resistance at Ankles - 10 reps - 2 sets - 1x daily - 7x weekly Standing Single Leg Stance with Unilateral Counter Support - 4  reps - 1 sets - 10 sec hold - 1x daily - 7x weekly Tandem Stance with Support - 2 reps - 1 sets - 30 sec hold - 1x daily - 7x weekly Patient Education Forward Head Posture                    PT Education - 02/26/19 1311    Education Details  Access Code: [email or web address]    Person(s) Educated  Patient    Methods  Explanation;Demonstration;Verbal cues;Handout    Comprehension  Returned demonstration;Verbalized understanding       PT Short Term Goals - 02/26/19 1313      PT SHORT TERM GOAL #1   Title  independent with initial HEP    Time  4    Period  Weeks    Status  Achieved    Target Date  02/13/19      PT SHORT TERM GOAL #2   Title  able to sleep with pain decreased >/= 25%    Time  4    Period  Weeks    Status  Achieved    Target Date  02/13/19      PT SHORT TERM GOAL #3   Title  understand ways to sleep to decrease strain on the spine    Time  4    Period  Weeks    Status  Achieved    Target Date  02/13/19      PT SHORT TERM GOAL #4   Title  understand how to correct his posture to decrease strain on the spine    Time  4    Period  Weeks    Status  Achieved    Target Date  02/13/19        PT Long Term Goals - 02/26/19 1249      PT LONG TERM GOAL #1   Title  independent with advanced HEP    Time  8    Period  Weeks    Status  Achieved      PT LONG TERM GOAL #2   Title  able to sleep through the night without pain    Time  8    Period  Weeks    Status  Achieved      PT LONG TERM GOAL #3   Title  able to walk with his normal speed and with minimal to no back discomfort    Time  8    Period  Weeks    Status  Achieved      PT LONG TERM GOAL #4   Title  FOTO score </= 20% limitation    Baseline  30%    Time  8    Period  Weeks    Status  Not Met            Plan - 02/26/19 1248    Clinical Impression Statement  Patient is not having back pain. He is able to sleep most of the nights without waking up due to back pain. Patient has  increased in bilateral hip strength and trunk ROM. Patient is independent with his HEP and does them 6 out 7 days. Patient understands how to correct his posture in sitting and standing. When patient came in today he was standing upright. Patient is ready for discharge.    Personal Factors and Comorbidities  Age;Behavior Pattern    Examination-Activity Limitations  Locomotion Level;Sleep    Examination-Participation Restrictions  Community Activity    Stability/Clinical Decision Making  Stable/Uncomplicated    Rehab Potential  Excellent    PT Treatment/Interventions  Cryotherapy;Electrical Stimulation;Moist Heat;Traction;Therapeutic exercise;Therapeutic activities;Functional mobility training;Neuromuscular re-education;Patient/family education;Dry needling;Manual techniques;Spinal Manipulations    PT Next Visit Plan  Discharge to HEP    PT Home Exercise Plan  Access Code: [email or web address]    Consulted and Agree with Plan of Care  Patient       Patient will benefit from skilled therapeutic intervention in order to improve the following deficits and impairments:  Decreased range of motion, Difficulty walking, Increased fascial restricitons, Pain, Impaired flexibility, Decreased strength, Postural dysfunction  Visit Diagnosis: Acute bilateral low back pain without sciatica  Muscle weakness (generalized)  Abnormal posture     Problem List There are no problems to display for this patient.   Earlie Counts, PT 02/26/19 1:15 PM   [facility] [address] [place], [place], [address] Phone: [phone]   Fax:  [phone]  Name: DAYMOND CORDTS MRN: [id number] Date of Birth: April 21, 1935  PHYSICAL THERAPY DISCHARGE SUMMARY  Visits from Start of Care: 6  Current functional level related to goals / functional outcomes: See above.    Remaining deficits: See above.    Education / Equipment: HEP Plan: Patient agrees to  discharge.  Patient goals were met. Patient is being discharged due to meeting the stated rehab goals.  Thank you for the referral. Earlie Counts, PT 02/26/19 1:16 PM  ?????

## 2019-02-26 NOTE — Patient Instructions (Signed)
Access Code: [email or web address]  URL: [email or web address]  Date: 02/26/2019  Prepared by: Earlie Counts   Exercises Prone Press Up - 5 reps - 1 sets - 2 hold - 1x daily - 7x weekly Correct Seated Posture - 1 reps - 1 sets - 5 sec hold - 1x daily - 7x weekly Correct Standing Posture - 1 reps - 1 sets - 5 sec hold - 1x daily - 7x weekly Supine Lower Trunk Rotation - 15 reps - 2x daily - 7x weekly Seated Thoracic Lumbar Extension - 10 reps - 3 sets - 2x daily - 7x weekly Standing Sidebends - 10 reps - 3 sec hold - 2x daily - 7x weekly Prone Shoulder Extension - 10 reps - 1 sets - 1 sec hold - 1x daily - 7x weekly Standing shoulder flexion wall slides - 10 reps - 1 sets - 1 sec hold - 1x daily - 7x weekly Standing Bilateral Shoulder Scaption Wall Slide - 10 reps - 1 sets - 1x daily - 7x weekly Scapular Retraction with Resistance - 10 reps - 3 sets - 1x daily - 7x weekly Shoulder Extension with Resistance - 10 reps - 2 sets - 1x daily - 7x weekly Standing Hip Extension Kicks - 10 reps - 1 sets - 1x daily - 7x weekly Standing Hip Abduction with Resistance at Ankles - 10 reps - 2 sets - 1x daily - 7x weekly Standing Single Leg Stance with Unilateral Counter Support - 4 reps - 1 sets - 10 sec hold - 1x daily - 7x weekly Tandem Stance with Support - 2 reps - 1 sets - 30 sec hold - 1x daily - 7x weekly Patient Education Forward [facility] [address] [place], [place] [address] Phone # [phone] Fax [phone]

## 2019-03-12 ENCOUNTER — Ambulatory Visit: Payer: Medicare Other | Attending: Internal Medicine

## 2019-03-12 DIAGNOSIS — Z23 Encounter for immunization: Secondary | ICD-10-CM

## 2019-03-12 NOTE — Progress Notes (Signed)
   Covid-19 Vaccination Clinic  Name:  Daryl Mitchell    MRN: [id number] DOB: Feb 02, 1936  03/12/2019  Mr. Donohoe was observed post Covid-19 immunization for 15 minutes without incidence. He was provided with Vaccine Information Sheet and instruction to access the V-Safe system.   Mr. Okerson was instructed to call 911 with any severe reactions post vaccine: Marland Kitchen Difficulty breathing  . Swelling of your face and throat  . A fast heartbeat  . A bad rash all over your body  . Dizziness and weakness    Immunizations Administered    Name Date Dose VIS Date Route   Pfizer COVID-19 Vaccine 03/12/2019  9:02 AM 0.3 mL 02/06/2019 Intramuscular   Manufacturer: [employer]   Lot: [id number]   [place]: [id number]

## 2019-03-21 ENCOUNTER — Ambulatory Visit: Payer: Medicare Other

## 2019-03-23 ENCOUNTER — Ambulatory Visit: Payer: Medicare Other

## 2019-03-30 ENCOUNTER — Ambulatory Visit: Payer: Medicare Other | Attending: Internal Medicine

## 2019-03-30 DIAGNOSIS — Z23 Encounter for immunization: Secondary | ICD-10-CM | POA: Insufficient documentation

## 2019-03-30 NOTE — Progress Notes (Signed)
   Covid-19 Vaccination Clinic  Name:  Daryl Mitchell    MRN: [id number] DOB: [birth year]  03/30/2019  Mr. Daryl Mitchell was observed post Covid-19 immunization for 15 minutes without incidence. He was provided with Vaccine Information Sheet and instruction to access the V-Safe system.   Mr. Daryl Mitchell was instructed to call 911 with any severe reactions post vaccine: Marland Kitchen Difficulty breathing  . Swelling of your face and throat  . A fast heartbeat  . A bad rash all over your body  . Dizziness and weakness    Immunizations Administered    Name Date Dose VIS Date Route   Pfizer COVID-19 Vaccine 03/30/2019 10:00 AM 0.3 mL 02/06/2019 Intramuscular   Manufacturer: [place]   Lot: [id number]   [place]: [id number]

## 2020-03-03 DIAGNOSIS — E782 Mixed hyperlipidemia: Secondary | ICD-10-CM | POA: Diagnosis not present

## 2020-03-03 DIAGNOSIS — R7301 Impaired fasting glucose: Secondary | ICD-10-CM | POA: Diagnosis not present

## 2020-03-07 DIAGNOSIS — M25559 Pain in unspecified hip: Secondary | ICD-10-CM | POA: Diagnosis not present

## 2020-03-07 DIAGNOSIS — Z0000 Encounter for general adult medical examination without abnormal findings: Secondary | ICD-10-CM | POA: Diagnosis not present

## 2020-03-24 DIAGNOSIS — L03031 Cellulitis of right toe: Secondary | ICD-10-CM | POA: Diagnosis not present

## 2020-03-25 DIAGNOSIS — H6123 Impacted cerumen, bilateral: Secondary | ICD-10-CM | POA: Diagnosis not present

## 2020-06-07 DIAGNOSIS — H401112 Primary open-angle glaucoma, right eye, moderate stage: Secondary | ICD-10-CM | POA: Diagnosis not present

## 2020-06-07 DIAGNOSIS — H1045 Other chronic allergic conjunctivitis: Secondary | ICD-10-CM | POA: Diagnosis not present

## 2020-06-07 DIAGNOSIS — H0102B Squamous blepharitis left eye, upper and lower eyelids: Secondary | ICD-10-CM | POA: Diagnosis not present

## 2020-06-07 DIAGNOSIS — Z961 Presence of intraocular lens: Secondary | ICD-10-CM | POA: Diagnosis not present

## 2020-06-07 DIAGNOSIS — H401121 Primary open-angle glaucoma, left eye, mild stage: Secondary | ICD-10-CM | POA: Diagnosis not present

## 2020-06-07 DIAGNOSIS — H0102A Squamous blepharitis right eye, upper and lower eyelids: Secondary | ICD-10-CM | POA: Diagnosis not present

## 2020-07-05 DIAGNOSIS — H0102B Squamous blepharitis left eye, upper and lower eyelids: Secondary | ICD-10-CM | POA: Diagnosis not present

## 2020-07-05 DIAGNOSIS — Z961 Presence of intraocular lens: Secondary | ICD-10-CM | POA: Diagnosis not present

## 2020-07-05 DIAGNOSIS — H401113 Primary open-angle glaucoma, right eye, severe stage: Secondary | ICD-10-CM | POA: Diagnosis not present

## 2020-07-05 DIAGNOSIS — H0102A Squamous blepharitis right eye, upper and lower eyelids: Secondary | ICD-10-CM | POA: Diagnosis not present

## 2020-07-05 DIAGNOSIS — H1045 Other chronic allergic conjunctivitis: Secondary | ICD-10-CM | POA: Diagnosis not present

## 2020-08-10 DIAGNOSIS — Z85820 Personal history of malignant melanoma of skin: Secondary | ICD-10-CM | POA: Diagnosis not present

## 2020-08-10 DIAGNOSIS — D1801 Hemangioma of skin and subcutaneous tissue: Secondary | ICD-10-CM | POA: Diagnosis not present

## 2020-08-10 DIAGNOSIS — L821 Other seborrheic keratosis: Secondary | ICD-10-CM | POA: Diagnosis not present

## 2020-08-10 DIAGNOSIS — Z85828 Personal history of other malignant neoplasm of skin: Secondary | ICD-10-CM | POA: Diagnosis not present

## 2020-08-10 DIAGNOSIS — L820 Inflamed seborrheic keratosis: Secondary | ICD-10-CM | POA: Diagnosis not present

## 2020-08-10 DIAGNOSIS — L570 Actinic keratosis: Secondary | ICD-10-CM | POA: Diagnosis not present

## 2020-09-06 DIAGNOSIS — H401113 Primary open-angle glaucoma, right eye, severe stage: Secondary | ICD-10-CM | POA: Diagnosis not present

## 2020-09-06 DIAGNOSIS — Z961 Presence of intraocular lens: Secondary | ICD-10-CM | POA: Diagnosis not present

## 2020-09-06 DIAGNOSIS — H401121 Primary open-angle glaucoma, left eye, mild stage: Secondary | ICD-10-CM | POA: Diagnosis not present

## 2020-09-06 DIAGNOSIS — H0102B Squamous blepharitis left eye, upper and lower eyelids: Secondary | ICD-10-CM | POA: Diagnosis not present

## 2020-09-06 DIAGNOSIS — H1045 Other chronic allergic conjunctivitis: Secondary | ICD-10-CM | POA: Diagnosis not present

## 2020-09-06 DIAGNOSIS — H0102A Squamous blepharitis right eye, upper and lower eyelids: Secondary | ICD-10-CM | POA: Diagnosis not present

## 2021-01-09 DIAGNOSIS — H401113 Primary open-angle glaucoma, right eye, severe stage: Secondary | ICD-10-CM | POA: Diagnosis not present

## 2021-01-09 DIAGNOSIS — H401121 Primary open-angle glaucoma, left eye, mild stage: Secondary | ICD-10-CM | POA: Diagnosis not present

## 2021-01-09 DIAGNOSIS — H0102B Squamous blepharitis left eye, upper and lower eyelids: Secondary | ICD-10-CM | POA: Diagnosis not present

## 2021-01-09 DIAGNOSIS — Z961 Presence of intraocular lens: Secondary | ICD-10-CM | POA: Diagnosis not present

## 2021-01-09 DIAGNOSIS — H1045 Other chronic allergic conjunctivitis: Secondary | ICD-10-CM | POA: Diagnosis not present

## 2021-01-09 DIAGNOSIS — H0102A Squamous blepharitis right eye, upper and lower eyelids: Secondary | ICD-10-CM | POA: Diagnosis not present

## 2021-02-13 DIAGNOSIS — Z85828 Personal history of other malignant neoplasm of skin: Secondary | ICD-10-CM | POA: Diagnosis not present

## 2021-02-13 DIAGNOSIS — L738 Other specified follicular disorders: Secondary | ICD-10-CM | POA: Diagnosis not present

## 2021-02-13 DIAGNOSIS — L84 Corns and callosities: Secondary | ICD-10-CM | POA: Diagnosis not present

## 2021-02-13 DIAGNOSIS — Z85820 Personal history of malignant melanoma of skin: Secondary | ICD-10-CM | POA: Diagnosis not present

## 2021-02-13 DIAGNOSIS — L821 Other seborrheic keratosis: Secondary | ICD-10-CM | POA: Diagnosis not present

## 2021-02-13 DIAGNOSIS — L570 Actinic keratosis: Secondary | ICD-10-CM | POA: Diagnosis not present

## 2021-02-13 DIAGNOSIS — D1801 Hemangioma of skin and subcutaneous tissue: Secondary | ICD-10-CM | POA: Diagnosis not present

## 2021-03-02 ENCOUNTER — Ambulatory Visit (HOSPITAL_COMMUNITY)
Admission: EM | Admit: 2021-03-02 | Discharge: 2021-03-03 | Disposition: A | Discharge status: Home / Self Care | Payer: Medicare Other | Attending: Cardiology | Admitting: Cardiology

## 2021-03-02 ENCOUNTER — Encounter (HOSPITAL_COMMUNITY): Payer: Self-pay

## 2021-03-02 ENCOUNTER — Encounter (HOSPITAL_COMMUNITY)
Admission: EM | Disposition: A | Discharge status: Home / Self Care | Payer: Self-pay | Source: Home / Self Care | Attending: Emergency Medicine

## 2021-03-02 ENCOUNTER — Other Ambulatory Visit: Payer: Self-pay

## 2021-03-02 ENCOUNTER — Emergency Department (HOSPITAL_COMMUNITY): Payer: Medicare Other

## 2021-03-02 DIAGNOSIS — Z20822 Contact with and (suspected) exposure to covid-19: Secondary | ICD-10-CM | POA: Insufficient documentation

## 2021-03-02 DIAGNOSIS — H409 Unspecified glaucoma: Secondary | ICD-10-CM | POA: Diagnosis not present

## 2021-03-02 DIAGNOSIS — R0609 Other forms of dyspnea: Secondary | ICD-10-CM | POA: Insufficient documentation

## 2021-03-02 DIAGNOSIS — R531 Weakness: Secondary | ICD-10-CM | POA: Diagnosis not present

## 2021-03-02 DIAGNOSIS — R5383 Other fatigue: Secondary | ICD-10-CM | POA: Insufficient documentation

## 2021-03-02 DIAGNOSIS — Z95818 Presence of other cardiac implants and grafts: Secondary | ICD-10-CM

## 2021-03-02 DIAGNOSIS — R001 Bradycardia, unspecified: Secondary | ICD-10-CM | POA: Insufficient documentation

## 2021-03-02 DIAGNOSIS — I441 Atrioventricular block, second degree: Secondary | ICD-10-CM | POA: Insufficient documentation

## 2021-03-02 HISTORY — PX: PACEMAKER IMPLANT: EP1218

## 2021-03-02 LAB — MAGNESIUM: Magnesium: 2 mg/dL (ref 1.7–2.4)

## 2021-03-02 LAB — CBC WITH DIFFERENTIAL/PLATELET
Abs Immature Granulocytes: 0.03 10*3/uL (ref 0.00–0.07)
Basophils Absolute: 0.1 10*3/uL (ref 0.0–0.1)
Basophils Relative: 1 %
Eosinophils Absolute: 0.2 10*3/uL (ref 0.0–0.5)
Eosinophils Relative: 2 %
HCT: 43.4 % (ref 39.0–52.0)
Hemoglobin: 14.4 g/dL (ref 13.0–17.0)
Immature Granulocytes: 0 %
Lymphocytes Relative: 23 %
Lymphs Abs: 1.8 10*3/uL (ref 0.7–4.0)
MCH: 32.4 pg (ref 26.0–34.0)
MCHC: 33.2 g/dL (ref 30.0–36.0)
MCV: 97.7 fL (ref 80.0–100.0)
Monocytes Absolute: 0.9 10*3/uL (ref 0.1–1.0)
Monocytes Relative: 11 %
Neutro Abs: 4.8 10*3/uL (ref 1.7–7.7)
Neutrophils Relative %: 63 %
Platelets: 202 10*3/uL (ref 150–400)
RBC: 4.44 MIL/uL (ref 4.22–5.81)
RDW: 12.3 % (ref 11.5–15.5)
WBC: 7.7 10*3/uL (ref 4.0–10.5)
nRBC: 0 % (ref 0.0–0.2)

## 2021-03-02 LAB — COMPREHENSIVE METABOLIC PANEL
ALT: 11 U/L (ref 0–44)
AST: 26 U/L (ref 15–41)
Albumin: 3.8 g/dL (ref 3.5–5.0)
Alkaline Phosphatase: 67 U/L (ref 38–126)
Anion gap: 10 (ref 5–15)
BUN: 17 mg/dL (ref 8–23)
CO2: 23 mmol/L (ref 22–32)
Calcium: 9.1 mg/dL (ref 8.9–10.3)
Chloride: 100 mmol/L (ref 98–111)
Creatinine, Ser: 1.07 mg/dL (ref 0.61–1.24)
GFR, Estimated: >60 mL/min (ref >60)
Glucose, Bld: 104 mg/dL — ABNORMAL HIGH (ref 70–99)
Potassium: 4.7 mmol/L (ref 3.5–5.1)
Sodium: 133 mmol/L — ABNORMAL LOW (ref 135–145)
Total Bilirubin: 0.8 mg/dL (ref 0.3–1.2)
Total Protein: 6.5 g/dL (ref 6.5–8.1)

## 2021-03-02 LAB — TROPONIN I (HIGH SENSITIVITY)
Troponin I (High Sensitivity): 8 ng/L (ref <18)
Troponin I (High Sensitivity): 10 ng/L (ref <18)

## 2021-03-02 LAB — I-STAT CHEM 8, ED
BUN: 21 mg/dL (ref 8–23)
Calcium, Ion: 1.16 mmol/L (ref 1.15–1.40)
Chloride: 102 mmol/L (ref 98–111)
Creatinine, Ser: 0.9 mg/dL (ref 0.61–1.24)
Glucose, Bld: 101 mg/dL — ABNORMAL HIGH (ref 70–99)
HCT: 42 % (ref 39.0–52.0)
Hemoglobin: 14.3 g/dL (ref 13.0–17.0)
Potassium: 5 mmol/L (ref 3.5–5.1)
Sodium: 135 mmol/L (ref 135–145)
TCO2: 26 mmol/L (ref 22–32)

## 2021-03-02 LAB — RESP PANEL BY RT-PCR (FLU A&B, COVID) ARPGX2
Influenza A by PCR: NEGATIVE
Influenza B by PCR: NEGATIVE
SARS Coronavirus 2 by RT PCR: NEGATIVE

## 2021-03-02 LAB — BRAIN NATRIURETIC PEPTIDE: B Natriuretic Peptide: 123.3 pg/mL — ABNORMAL HIGH (ref 0.0–100.0)

## 2021-03-02 LAB — TSH: TSH: 2.854 u[IU]/mL (ref 0.350–4.500)

## 2021-03-02 LAB — T4, FREE: Free T4: 1.13 ng/dL — ABNORMAL HIGH (ref 0.61–1.12)

## 2021-03-02 SURGERY — PACEMAKER IMPLANT

## 2021-03-02 MED ORDER — CHLORHEXIDINE GLUCONATE 4 % EX LIQD
60.0000 mL | Freq: Once | CUTANEOUS | Status: DC
  Filled 2021-03-02: qty 60

## 2021-03-02 MED ORDER — ADULT MULTIVITAMIN W/MINERALS CH
1.0000 | ORAL_TABLET | Freq: Every day | ORAL | Status: DC
  Administered 2021-03-03: 1 via ORAL
  Filled 2021-03-02: qty 1

## 2021-03-02 MED ORDER — ONDANSETRON HCL 4 MG/2ML IJ SOLN: 4.0000 mg | Freq: Four times a day (QID) | INTRAMUSCULAR | Status: DC | PRN

## 2021-03-02 MED ORDER — SODIUM CHLORIDE 0.9% FLUSH: 3.0000 mL | INTRAVENOUS | Status: DC | PRN

## 2021-03-02 MED ORDER — HEPARIN (PORCINE) IN NACL 1000-0.9 UT/500ML-% IV SOLN
INTRAVENOUS | Status: DC | PRN
  Administered 2021-03-02: 500 mL

## 2021-03-02 MED ORDER — LIDOCAINE HCL (PF) 1 % IJ SOLN
INTRAMUSCULAR | Status: DC | PRN
  Administered 2021-03-02: 60 mL

## 2021-03-02 MED ORDER — VANCOMYCIN HCL IN DEXTROSE 1-5 GM/200ML-% IV SOLN
1000.0000 mg | INTRAVENOUS | Status: AC
  Administered 2021-03-02: 1000 mg via INTRAVENOUS

## 2021-03-02 MED ORDER — SODIUM CHLORIDE 0.9 % IV SOLN
INTRAVENOUS | Status: AC
  Filled 2021-03-02: qty 2

## 2021-03-02 MED ORDER — SODIUM CHLORIDE 0.9 % IV SOLN
80.0000 mg | INTRAVENOUS | Status: AC
  Administered 2021-03-02: 80 mg
  Filled 2021-03-02: qty 2

## 2021-03-02 MED ORDER — HEPARIN (PORCINE) IN NACL 1000-0.9 UT/500ML-% IV SOLN
INTRAVENOUS | Status: AC
  Filled 2021-03-02: qty 500

## 2021-03-02 MED ORDER — OYSTER SHELL CALCIUM/D3 500-5 MG-MCG PO TABS
1.0000 | ORAL_TABLET | Freq: Every day | ORAL | Status: DC
  Administered 2021-03-03: 1 via ORAL
  Filled 2021-03-02: qty 1

## 2021-03-02 MED ORDER — SODIUM CHLORIDE 0.9 % IV SOLN: 250.0000 mL | INTRAVENOUS | Status: DC

## 2021-03-02 MED ORDER — VANCOMYCIN HCL IN DEXTROSE 1-5 GM/200ML-% IV SOLN
1000.0000 mg | Freq: Two times a day (BID) | INTRAVENOUS | Status: AC
  Administered 2021-03-03: 1000 mg via INTRAVENOUS
  Filled 2021-03-02: qty 200

## 2021-03-02 MED ORDER — ACETAMINOPHEN 325 MG PO TABS
325.0000 mg | ORAL_TABLET | ORAL | Status: DC | PRN
  Administered 2021-03-02: 650 mg via ORAL
  Filled 2021-03-02: qty 2

## 2021-03-02 MED ORDER — IBUPROFEN 200 MG PO TABS: 200.0000 mg | ORAL_TABLET | Freq: Four times a day (QID) | ORAL | Status: DC | PRN

## 2021-03-02 MED ORDER — NAPROXEN SODIUM ER 500 MG PO TB24
500.0000 mg | ORAL_TABLET | Freq: Every day | ORAL | Status: DC
Start: 2021-03-03 — End: 2021-03-03

## 2021-03-02 MED ORDER — SODIUM CHLORIDE 0.9 % IV SOLN: INTRAVENOUS | Status: DC

## 2021-03-02 MED ORDER — ONE-DAILY MULTI VITAMINS PO TABS: 1.0000 | ORAL_TABLET | Freq: Every day | ORAL | Status: DC

## 2021-03-02 MED ORDER — SODIUM CHLORIDE 0.9% FLUSH
3.0000 mL | Freq: Two times a day (BID) | INTRAVENOUS | Status: DC
  Administered 2021-03-02: 3 mL via INTRAVENOUS

## 2021-03-02 MED ORDER — VANCOMYCIN HCL IN DEXTROSE 1-5 GM/200ML-% IV SOLN
INTRAVENOUS | Status: AC
  Filled 2021-03-02: qty 200

## 2021-03-02 MED ORDER — LIDOCAINE HCL (PF) 1 % IJ SOLN
INTRAMUSCULAR | Status: AC
  Filled 2021-03-02: qty 60

## 2021-03-02 MED ORDER — GLUCOSAMINE-CHONDROITIN 500-400 MG PO TABS: 1.0000 | ORAL_TABLET | Freq: Three times a day (TID) | ORAL | Status: DC

## 2021-03-02 SURGICAL SUPPLY — 11 items
CABLE SURGICAL S-101-97-12 (CABLE) ×2 IMPLANT
IPG PACE AZUR XT DR MRI W1DR01 (Pacemaker) IMPLANT
LEAD CAPSURE NOVUS 5076-52CM (Lead) ×1 IMPLANT
LEAD CAPSURE NOVUS 5076-58CM (Lead) ×1 IMPLANT
PACE AZURE XT DR MRI W1DR01 (Pacemaker) ×2 IMPLANT
PAD DEFIB RADIO PHYSIO CONN (PAD) ×2 IMPLANT
SHEATH 7FR PRELUDE SNAP 13 (SHEATH) ×2 IMPLANT
SHEATH PROBE COVER 6X72 (BAG) ×1 IMPLANT
TRAY PACEMAKER INSERTION (PACKS) ×2 IMPLANT
WIRE HI TORQ VERSACORE-J 145CM (WIRE) ×1 IMPLANT
WIRE MICRO SET SILHO 5FR 7 (SHEATH) ×1 IMPLANT

## 2021-03-02 NOTE — Interval H&P Note (Signed)
History and Physical Interval Note:  03/02/2021 4:44 PM  Daryl Mitchell  has presented today for surgery, with the diagnosis of heart block.  The various methods of treatment have been discussed with the patient and family. After consideration of risks, benefits and other options for treatment, the patient has consented to  Procedure(s): PACEMAKER IMPLANT (N/A) as a surgical intervention.  The patient's history has been reviewed, patient examined, no change in status, stable for surgery.  I have reviewed the patient's chart and labs.  Questions were answered to the patient's satisfaction.     Orell Hurtado [employer]

## 2021-03-02 NOTE — H&P (Addendum)
Cardiology Admission History and Physical:   Patient ID: Daryl Mitchell MRN: [id number]; DOB: 19-Jan-1936   Admission date: 03/02/2021  PCP:  Lawerance Cruel, MD   [facility] Providers Cardiologist:  None   {   Chief Complaint:  fatigue, weakness  Patient Profile:   Daryl Mitchell is a 86 y.o. male with no significant PMHx, reports has been healthy most his life who is being seen 03/02/2021 for the evaluation of advacned heart block.  History of Present Illness:   Daryl Mitchell reports for a few days he has had unusual fatigue, getting tired easily and unusually for him, no near syncope or syncope, no CP or palpitations He went to his PMD who referred him to the ER for bradycardia.  He was found in 2:1 AVblock rates 30's-40's, stable BP LABS K+ 4.7, 5.0 BUN/Creat 21/0.90 Mag 2.0 BNP 123 WBC 7.7 H/H 14/43 Plts 202 TSH 2.854  Resp panel neg CXR with NAD   Past Medical History:  Diagnosis Date   Arthritis    Cancer ([place])    Basal and squamous cell carcinoma, Dr.Gruber   Diverticulosis    GERD (gastroesophageal reflux disease)    Hyperlipidemia    Nocturia    Peripheral neuropathy     Past Surgical History:  Procedure Laterality Date   COLONOSCOPY  08/27/2012   skin cancer removal       Medications Prior to Admission: Prior to Admission medications   Medication Sig Start Date End Date Taking? Authorizing Provider  B Complex Vitamins (B COMPLEX 1 PO) Take 1 tablet by mouth daily.    [provider]  Calcium Carb-Cholecalciferol (CALCIUM + D3) 600-200 MG-UNIT TABS Take 1 tablet by mouth daily.    [provider]  glucosamine-chondroitin 500-400 MG tablet Take 1 tablet by mouth 3 (three) times daily.    [provider]  ibuprofen (ADVIL,MOTRIN) 200 MG tablet Take 200 mg by mouth every 6 (six) hours as needed.    [provider]  Multiple Vitamin (MULTIVITAMIN) tablet Take 1 tablet by mouth daily.    [provider]   naproxen (NAPRELAN) 500 MG 24 hr tablet Take 500 mg by mouth daily with breakfast.    [provider]  Omega-3 Fatty Acids (FISH OIL) 1000 MG CAPS Take 1 capsule by mouth daily. Pt takes 1200 mg    [provider]  sildenafil (VIAGRA) 100 MG tablet Take 100 mg by mouth daily as needed for erectile dysfunction.    [provider]     Allergies:    Allergies  Allergen Reactions   Codeine Nausea Only    Upset stomach   Penicillins Other (See Comments)    flushed face    Social History:   Social History   Socioeconomic History   Marital status: Married    Spouse name: Not on file   Number of children: Not on file   Years of education: Not on file   Highest education level: Not on file  Occupational History   Not on file  Tobacco Use   Smoking status: Former    Years: 25.00    Types: Cigarettes   Smokeless tobacco: Not on file  Substance and Sexual Activity   Alcohol use: Yes    Comment: 2 drinks qod   Drug use: No   Sexual activity: Not on file  Other Topics Concern   Not on file  Social History Narrative   Not on file   Social Determinants of  Health   Financial Resource Strain: Not on file  Food Insecurity: Not on file  Transportation Needs: Not on file  Physical Activity: Not on file  Stress: Not on file  Social Connections: Not on file  Intimate Partner Violence: Not on file    Family History:   The patient's family history includes Diabetes in his brother; Heart failure in his mother; Hypothyroidism in his son.    ROS:  Please see the history of present illness.  All other ROS reviewed and negative.     Physical Exam/Data:   Vitals:   03/02/21 1430 03/02/21 1445 03/02/21 1500 03/02/21 1515  BP: (!) 144/63 (!) 153/64 (!) 152/61 (!) 161/64  Pulse: (!) 40 (!) 45 (!) 40 (!) 41  Resp: 16 16 16 15   Temp:      TempSrc:      SpO2: 96% 95% 99% 98%   No intake or output data in the 24 hours ending 03/02/21 1544 No flowsheet data  found.   There is no height or weight on file to calculate BMI.  General:  Well nourished, well developed, in no acute distress HEENT: normal Neck: no JVD Vascular: No carotid bruits; Distal pulses 2+ bilaterally   Cardiac:  RRR; bradycardic, no murmurs, gallops or rubs Lungs:  CTA b/l, no wheezing, rhonchi or rales  Abd: soft, nontender, no hepatomegaly  Ext: no edema Musculoskeletal:  No deformities, BUE and BLE strength normal and equal Skin: warm and dry  Neuro:  CNs 2-12 intact, no focal abnormalities noted Psych:  Normal affect    EKG:  The ECG that was done today was personally reviewed and demonstrates  2:1 AVblock 41bpm  Relevant CV Studies:  No historical EKGs  Laboratory Data:  High Sensitivity Troponin:   Recent Labs  Lab 03/02/21 1401  TROPONINIHS 8      Chemistry Recent Labs  Lab 03/02/21 1401 03/02/21 1436  NA 133* 135  K 4.7 5.0  CL 100 102  CO2 23  --   GLUCOSE 104* 101*  BUN 17 21  CREATININE 1.07 0.90  CALCIUM 9.1  --   MG 2.0  --   GFRNONAA >60  --   ANIONGAP 10  --     Recent Labs  Lab 03/02/21 1401  PROT 6.5  ALBUMIN 3.8  AST 26  ALT 11  ALKPHOS 67  BILITOT 0.8   Lipids No results for input(s): CHOL, TRIG, HDL, LABVLDL, LDLCALC, CHOLHDL in the last 168 hours. Hematology Recent Labs  Lab 03/02/21 1401 03/02/21 1436  WBC 7.7  --   RBC 4.44  --   HGB 14.4 14.3  HCT 43.4 42.0  MCV 97.7  --   MCH 32.4  --   MCHC 33.2  --   RDW 12.3  --   PLT 202  --    Thyroid  Recent Labs  Lab 03/02/21 1401  TSH 2.854  FREET4 1.13*   BNPNo results for input(s): BNP, PROBNP in the last 168 hours.  DDimer No results for input(s): DDIMER in the last 168 hours.   Radiology/Studies:  DG Chest 2 View Result Date: 03/02/2021 CLINICAL DATA:  weakness EXAM: CHEST - 2 VIEW COMPARISON:  11/02/2015. FINDINGS: No consolidation. Mild chronic linear left basilar subsegmental atelectasis/scarring. No visible pleural effusions or pneumothorax.  Biapical pleuroparenchymal scarring. Cardiomediastinal silhouette is similar and within normal limits. Similar tortuous aorta. Polyarticular degenerative change. IMPRESSION: No active cardiopulmonary disease. Electronically Signed   By: Margaretha Sheffield M.D.   On:  03/02/2021 13:51     Assessment and Plan:   Symptomatic bradycardia 2:1 Avblock  No reversible causes Dr. Curt Bears has seen and examined the patient Quick look echo with preserved LVEF Recommend PPM, discussed with the patient and his wife rational for PPM the procedure, potential risks and benefits They are both agreeable     Risk Assessment/Risk Scores:    For questions or updates, please contact [place] Please consult [email or web address] for contact info under     Signed, Baldwin [place], PA-C  03/02/2021 3:44 PM   I have seen and examined this patient with Tommye Standard.  Agree with above, note added to reflect my findings.  Patient presented to the emergency room after feeling weak and fatigued.  He went to his primary physician's office today and was noted to be bradycardic with heart rates in the 40s.  He currently feels well at rest but has been having difficulty doing his daily activities.  GEN: Well nourished, well developed, in no acute distress  HEENT: normal  Neck: no JVD, carotid bruits, or masses Cardiac: Bradycardic, regular; no murmurs, rubs, or gallops,no edema  Respiratory:  clear to auscultation bilaterally, normal work of breathing GI: soft, nontender, nondistended, + BS MS: no deformity or atrophy  Skin: warm and dry Neuro:  Strength and sensation are intact Psych: euthymic mood, full affect   Second-degree 2-1 AV block: Feels weak and fatigued as well as short of breath with exertion.  No reversible cause.  We Daryl Mitchell plan for pacemaker implant.  Risks and benefits of been discussed which clued bleeding, tamponade, infection, pneumothorax, among others.  He understand these risks and is agreed to  the procedure.  Bedside echo performed today by me shows a normal ejection fraction.  Daryl Mitchell M. Perian Tedder MD 03/02/2021 4:07 PM

## 2021-03-02 NOTE — ED Provider Triage Note (Signed)
Emergency Medicine Provider Triage Evaluation Note  ROBEL WUERTZ , a 86 y.o. male  was evaluated in triage.  Pt complains of symptomatic bradycardia.  Noted yesterday he felt weak.  Checked his pulse ox at home with noted bradycardia.  Seen by PCP at [facility] today sent here for further evaluation.  He denies any chest pain, shortness of breath, fever, cough, back or abdominal pain.  He denies any home medication such as beta-blockers, calcium channel blockers or blood pressure medications..  Review of Systems  Positive: Symptomatic bradycardia Negative:   Physical Exam  There were no vitals taken for this visit. Gen:   Awake, no distress   Resp:  Normal effort  Cardiac: Bradycardia heart rate 40 MSK:   Moves extremities without difficulty  Other:    Medical Decision Making  Medically screening exam initiated at 1:25 PM.  Appropriate orders placed.  STILLMAN BUENGER was informed that the remainder of the evaluation will be completed by another provider, this initial triage assessment does not replace that evaluation, and the importance of remaining in the ED until their evaluation is complete.  Symptomatic bradycardia, Charge nurse Kathlee Nations,  aware patient needs room in back   Jmarion Christiano A, PA-C 03/02/21 1327

## 2021-03-02 NOTE — ED Provider Notes (Signed)
[facility] EMERGENCY DEPARTMENT Provider Note   CSN: [id number] Arrival date & time: 03/02/21  1254     History  Chief Complaint  Patient presents with   Bradycardia    Daryl Mitchell is a 86 y.o. male.  HPI Patient is an 86 year old gentleman with past medical history notable for HLD, arthritis, peripheral neuropathy and reflux  He denies any history of heart attack, heart disease, stroke and is not a smoker  He also denies being on any beta-blocker or calcium channel blocker medications and I reviewed his chart which corroborates this.  Patient is presented to emergency room with complaints of fatigue seems that her symptoms been ongoing for 1-2 weeks.  Denies any chest pain or difficulty breathing.  He denies any heart palpitations.  No history of similar.  He states that it is somewhat positional somewhat lightheaded with standing up quickly however states that he is just weak and tired all the time.   No cough congestion fevers chills nausea vomiting diarrhea    Home Medications Prior to Admission medications   Medication Sig Start Date End Date Taking? Authorizing Provider  B Complex Vitamins (B COMPLEX 1 PO) Take 1 tablet by mouth daily.    [provider]  Calcium Carb-Cholecalciferol (CALCIUM + D3) 600-200 MG-UNIT TABS Take 1 tablet by mouth daily.    [provider]  glucosamine-chondroitin 500-400 MG tablet Take 1 tablet by mouth 3 (three) times daily.    [provider]  ibuprofen (ADVIL,MOTRIN) 200 MG tablet Take 200 mg by mouth every 6 (six) hours as needed.    [provider]  Multiple Vitamin (MULTIVITAMIN) tablet Take 1 tablet by mouth daily.    [provider]  naproxen (NAPRELAN) 500 MG 24 hr tablet Take 500 mg by mouth daily with breakfast.    [provider]  Omega-3 Fatty Acids (FISH OIL) 1000 MG CAPS Take 1 capsule by mouth daily. Pt takes 1200 mg    [provider]   sildenafil (VIAGRA) 100 MG tablet Take 100 mg by mouth daily as needed for erectile dysfunction.    [provider]      Allergies    Codeine and Penicillins    Review of Systems   Review of Systems  Constitutional:  Positive for fatigue. Negative for chills and fever.  HENT:  Negative for congestion.   Eyes:  Negative for pain.  Respiratory:  Negative for cough and shortness of breath.   Cardiovascular:  Negative for chest pain and leg swelling.  Gastrointestinal:  Negative for abdominal pain and vomiting.  Genitourinary:  Negative for dysuria.  Musculoskeletal:  Negative for myalgias.  Skin:  Negative for rash.  Neurological:  Negative for dizziness and headaches.   Physical Exam Updated Vital Signs BP (!) 161/64    Pulse (!) 41    Temp 98 F (36.7 C) (Oral)    Resp 15    SpO2 98%  Physical Exam Vitals and nursing note reviewed.  Constitutional:      General: He is not in acute distress. HENT:     Head: Normocephalic and atraumatic.     Nose: Nose normal.  Eyes:     General: No scleral icterus. Cardiovascular:     Rate and Rhythm: Regular rhythm. Bradycardia present.     Pulses: Normal pulses.     Heart sounds: Normal heart sounds.  Pulmonary:     Effort: Pulmonary effort is normal. No respiratory distress.  Breath sounds: No wheezing.  Abdominal:     Palpations: Abdomen is soft.     Tenderness: There is no abdominal tenderness.  Musculoskeletal:     Cervical back: Normal range of motion.     Right lower leg: No edema.     Left lower leg: No edema.  Skin:    General: Skin is warm and dry.     Capillary Refill: Capillary refill takes less than 2 seconds.  Neurological:     Mental Status: He is alert. Mental status is at baseline.  Psychiatric:        Mood and Affect: Mood normal.        Behavior: Behavior normal.    ED Results / Procedures / Treatments   Labs (all labs ordered are listed, but only abnormal results are displayed) Labs Reviewed   BRAIN NATRIURETIC PEPTIDE - Abnormal; Notable for the following components:      Result Value   B Natriuretic Peptide 123.3 (*)    All other components within normal limits  T4, FREE - Abnormal; Notable for the following components:   Free T4 1.13 (*)    All other components within normal limits  COMPREHENSIVE METABOLIC PANEL - Abnormal; Notable for the following components:   Sodium 133 (*)    Glucose, Bld 104 (*)    All other components within normal limits  I-STAT CHEM 8, ED - Abnormal; Notable for the following components:   Glucose, Bld 101 (*)    All other components within normal limits  RESP PANEL BY RT-PCR (FLU A&B, COVID) ARPGX2  SURGICAL PCR SCREEN  CBC WITH DIFFERENTIAL/PLATELET  MAGNESIUM  TSH  TROPONIN I (HIGH SENSITIVITY)  TROPONIN I (HIGH SENSITIVITY)    EKG None  Radiology DG Chest 2 View  Result Date: 03/02/2021 CLINICAL DATA:  weakness EXAM: CHEST - 2 VIEW COMPARISON:  11/02/2015. FINDINGS: No consolidation. Mild chronic linear left basilar subsegmental atelectasis/scarring. No visible pleural effusions or pneumothorax. Biapical pleuroparenchymal scarring. Cardiomediastinal silhouette is similar and within normal limits. Similar tortuous aorta. Polyarticular degenerative change. IMPRESSION: No active cardiopulmonary disease. Electronically Signed   By: Margaretha Sheffield M.D.   On: 03/02/2021 13:51    Procedures .Critical Care Performed by: Tedd Sias, PA Authorized by: Tedd Sias, PA   Critical care provider statement:    Critical care time (minutes):  35   Critical care time was exclusive of:  Separately billable procedures and treating other patients and teaching time   Critical care was necessary to treat or prevent imminent or life-threatening deterioration of the following conditions: symptomatic bradycardia.   Critical care was time spent personally by me on the following activities:  Development of treatment plan with patient or  surrogate, review of old charts, re-evaluation of patient's condition, pulse oximetry, ordering and review of radiographic studies, ordering and review of laboratory studies, ordering and performing treatments and interventions, obtaining history from patient or surrogate, examination of patient and evaluation of patient's response to treatment   Care discussed with: admitting provider      Medications Ordered in ED Medications  0.9 %  sodium chloride infusion (has no administration in time range)  gentamicin (GARAMYCIN) 80 mg in sodium chloride 0.9 % 500 mL irrigation (has no administration in time range)  chlorhexidine (HIBICLENS) 4 % liquid 4 application (has no administration in time range)  chlorhexidine (HIBICLENS) 4 % liquid 4 application (has no administration in time range)  sodium chloride flush (NS) 0.9 % injection 3 mL (  Intravenous Automatically Held 03/10/21 2200)  sodium chloride flush (NS) 0.9 % injection 3 mL (has no administration in time range)  0.9 %  sodium chloride infusion (has no administration in time range)  vancomycin (VANCOCIN) IVPB 1000 mg/200 mL premix (has no administration in time range)    ED Course/ Medical Decision Making/ A&P Clinical Course as of 03/02/21 1631  Thu Mar 02, 2021  1420 Discussed with Trish cards secretary - Cardiology will see pt.  [WF]    Clinical Course User Index [WF] Tedd Sias, [place]                           Medical Decision Making  Patient is an 86 year old gentleman with a past medical history detailed in HPI presented to the emergency room today with secondary type II heart block.  Seems he has been symptomatic for approximately 7 days.  EKG reviewed by myself and Dr. Tamera Punt.  Confirm second-degree type II.  I discussed that cardiology secondary and cardiology provider will evaluate patient at bedside.  I discussed with patient that within the spectrum of treatment options it is possible that cardiology will opt to place a  pacemaker.  CMP unremarkable apart from mild hyponatremia.  CBC without leukocytosis or anemia.  TSH within normal limits BNP very marginally elevated.  Chest x-ray clear.  Patient closely monitored by me until he went to [facility] for pacemaker placement. 4:32 PM   Admitted by cardiology.  Final Clinical Impression(s) / ED Diagnoses Final diagnoses:  Bradycardia    Rx / DC Orders ED Discharge Orders     None         Tedd Sias, [place] 03/02/21 1632    Malvin Johns, MD 03/03/21 0900

## 2021-03-02 NOTE — ED Triage Notes (Signed)
Pt arrived POV from the drs office with a low heartrate. Pt denies any SHOB or CP at this time. Pt's HR is cuurently 39-41 bpm.

## 2021-03-03 ENCOUNTER — Ambulatory Visit (HOSPITAL_BASED_OUTPATIENT_CLINIC_OR_DEPARTMENT_OTHER): Payer: Medicare Other

## 2021-03-03 ENCOUNTER — Ambulatory Visit (HOSPITAL_COMMUNITY): Payer: Medicare Other

## 2021-03-03 ENCOUNTER — Encounter (HOSPITAL_COMMUNITY): Payer: Self-pay | Admitting: Cardiology

## 2021-03-03 DIAGNOSIS — R001 Bradycardia, unspecified: Secondary | ICD-10-CM | POA: Diagnosis not present

## 2021-03-03 DIAGNOSIS — R0609 Other forms of dyspnea: Secondary | ICD-10-CM | POA: Diagnosis not present

## 2021-03-03 DIAGNOSIS — Z950 Presence of cardiac pacemaker: Secondary | ICD-10-CM | POA: Diagnosis not present

## 2021-03-03 DIAGNOSIS — R531 Weakness: Secondary | ICD-10-CM | POA: Diagnosis not present

## 2021-03-03 DIAGNOSIS — I441 Atrioventricular block, second degree: Secondary | ICD-10-CM | POA: Diagnosis not present

## 2021-03-03 DIAGNOSIS — R5383 Other fatigue: Secondary | ICD-10-CM | POA: Diagnosis not present

## 2021-03-03 DIAGNOSIS — R9431 Abnormal electrocardiogram [ECG] [EKG]: Secondary | ICD-10-CM

## 2021-03-03 DIAGNOSIS — Z20822 Contact with and (suspected) exposure to covid-19: Secondary | ICD-10-CM | POA: Diagnosis not present

## 2021-03-03 LAB — ECHOCARDIOGRAM COMPLETE
AR max vel: 2.74 cm2
AV Peak grad: 5.8 mmHg
Ao pk vel: 1.21 m/s
Height: 68 in
P 1/2 time: 432 msec
S' Lateral: 2.2 cm
Weight: 2641.99 oz

## 2021-03-03 MED ORDER — NAPROXEN SODIUM ER 500 MG PO TB24: 500.0000 mg | ORAL_TABLET | Freq: Every day | ORAL | Status: DC

## 2021-03-03 MED ORDER — IBUPROFEN 200 MG PO TABS: 200.0000 mg | ORAL_TABLET | Freq: Four times a day (QID) | ORAL | 0 refills | Status: DC | PRN

## 2021-03-03 NOTE — Discharge Summary (Addendum)
ELECTROPHYSIOLOGY PROCEDURE DISCHARGE SUMMARY    Patient ID: Daryl Mitchell,  MRN: [id number], DOB/AGE: 07-15-1935 86 y.o.  Admit date: 03/02/2021 Discharge date: 03/03/2021  Primary Care Physician: Lawerance Cruel, MD Electrophysiologist: new to [facility], Dr. 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  Implantation of a MDT dual chamber PPM on 03/02/21 by Dr Curt Bears.  The patient received   Medtronic Azure XT DR MRI SureScan (serial number [id number]) pacemaker, Medtronic model [id number] (serial number [id number]) right atrial lead and a Medtronic model 5076 (serial number [id number]) right ventricular lead There were no immediate post procedure complications. 2.  CXR on 03/03/21 demonstrated no pneumothorax status post device implantation.   Brief HPI: Daryl Mitchell is a 86 y.o. male was referred to to ER with unusual fatigue, weakness, found bradycardic by his PMD and sent to the ER  Past medical history includes none known.   He was found in 2:1 AVblock and admitted for management  Hospital Course:  No reversible causes were found, labs unremarkable a quick look echo done at bedside by Dr. Curt Bears with preserved LVEF and planned for PPM.  He underwent implantation of a PPM with details as outlined above.  He was monitored on telemetry overnight which demonstrated SR/VP.  Left chest was without hematoma or ecchymosis.  The device was interrogated and found to be functioning normally.  CXR was obtained and demonstrated no pneumothorax status post device implantation.  Wound care, arm mobility, and restrictions were reviewed with the patient.  Official echo has been completed, pending read, in d/w Dr. Curt Bears,  this can be followed up outpatient given good LV function noted yesterday.  The patient feels well, denies any CP/SOB, with minimal site discomfort.  He was examined by Dr. Curt Bears and considered stable for discharge to home.    Physical Exam: Vitals:   03/02/21 2309 03/03/21 0000 03/03/21 0233 03/03/21 0821  BP: 138/82 124/84 (!) 142/89 (!) 143/83  Pulse: 96 86 82 91  Resp: 20 16 19 14   Temp: 98.2 F (36.8 C)  98.2 F (36.8 C) 98.3 F (36.8 C)  TempSrc: Oral  Oral Oral  SpO2: 95%  94% 96%  Weight:      Height:        GEN- The patient is well appearing, alert and oriented x 3 today.   HEENT: normocephalic, atraumatic; sclera clear, conjunctiva pink; hearing intact; oropharynx clear; neck supple, no JVP Lungs-  CTA b/l, normal work of breathing.  No wheezes, rales, rhonchi Heart- RRR, no murmurs, rubs or gallops, PMI not laterally displaced GI- soft, non-tender, non-distended Extremities- no clubbing, cyanosis, or edema MS- no significant deformity or atrophy Skin- warm and dry, no rash or lesion, left chest without hematoma/ecchymosis Psych- euthymic mood, full affect Neuro- no gross deficits   Labs:   Lab Results  Component Value Date   WBC 7.7 03/02/2021   HGB 14.3 03/02/2021   HCT 42.0 03/02/2021   MCV 97.7 03/02/2021   PLT 202 03/02/2021    Recent Labs  Lab 03/02/21 1401 03/02/21 1436  NA 133* 135  K 4.7 5.0  CL 100 102  CO2 23  --   BUN 17  21  CREATININE 1.07 0.90  CALCIUM 9.1  --   PROT 6.5  --   BILITOT 0.8  --   ALKPHOS 67  --   ALT 11  --   AST 26  --   GLUCOSE 104* 101*    Discharge Medications:  Allergies as of 03/03/2021       Reactions   Codeine Nausea Only   Upset stomach   Penicillins Other (See Comments)   flushed face        Medication List     TAKE these medications    acetaminophen 500 MG tablet Commonly known as: TYLENOL Take 1,000 mg by mouth every 6 (six) hours as needed for moderate pain.   B COMPLEX 1 PO Take 1  tablet by mouth daily.   Biotin 5 MG Caps Take 1 capsule by mouth daily.   dorzolamide-timolol 22.3-6.8 MG/ML ophthalmic solution Commonly known as: COSOPT Place 1 drop into both eyes 2 (two) times daily.   ibuprofen 200 MG tablet Commonly known as: ADVIL Take 1 tablet (200 mg total) by mouth every 6 (six) hours as needed.   multivitamin tablet Take 1 tablet by mouth daily.   naproxen 500 MG 24 hr tablet Commonly known as: NAPRELAN Take 1 tablet (500 mg total) by mouth daily with breakfast.   sildenafil 100 MG tablet Commonly known as: VIAGRA Take 100 mg by mouth daily as needed for erectile dysfunction.   Vyzulta 0.024 % Soln Generic drug: Latanoprostene Bunod Apply 1 drop to eye at bedtime.        Disposition: Home Discharge Instructions     Diet - low sodium heart healthy   Complete by: As directed    Increase activity slowly   Complete by: As directed         Duration of Discharge Encounter: Greater than 30 minutes including physician time.  Venetia Night, PA-C 03/03/2021 10:19 AM   I have seen and examined this patient with Tommye Standard.  Agree with above, note added to reflect my findings.  On exam, RRR, no murmurs, lungs clear.  He is now status post Medtronic pacemaker for second degree AV block.  Device functioning appropriately.  Chest x-ray and interrogation without issue.  Plan for discharge today with follow-up in device clinic.  Shanece Cochrane M. Wilfredo Canterbury MD 03/03/2021 10:32 AM

## 2021-03-03 NOTE — Discharge Instructions (Signed)
° ° °  Supplemental Discharge Instructions for  Pacemaker/Defibrillator Patients   Activity No heavy lifting or vigorous activity with your left/right arm for 6 to 8 weeks.  Do not raise your left/right arm above your head for one week.  Gradually raise your affected arm as drawn below.             03/06/21                       03/07/21                    03/08/21                   03/09/21 __  NO DRIVING until cleared to at your wound check visit .  WOUND CARE Keep the wound area clean and dry.  Do not get this area wet , no showers until cleared to at your wound check visit . The tape/steri-strips on your wound will fall off; do not pull them off.  No bandage is needed on the site.  DO  NOT apply any creams, oils, or ointments to the wound area. If you notice any drainage or discharge from the wound, any swelling or bruising at the site, or you develop a fever > 101? F after you are discharged home, call the office at once.  Special Instructions You are still able to use cellular telephones; use the ear opposite the side where you have your pacemaker/defibrillator.  Avoid carrying your cellular phone near your device. When traveling through airports, show security personnel your identification card to avoid being screened in the metal detectors.  Ask the security personnel to use the hand wand. Avoid arc welding equipment, MRI testing (magnetic resonance imaging), TENS units (transcutaneous nerve stimulators).  Call the office for questions about other devices. Avoid electrical appliances that are in poor condition or are not properly grounded. Microwave ovens are safe to be near or to operate.

## 2021-03-03 NOTE — Progress Notes (Signed)
Echocardiogram 2D Echocardiogram has been performed.  Daryl Mitchell 03/03/2021, 9:43 AM

## 2021-03-06 ENCOUNTER — Other Ambulatory Visit: Payer: Self-pay | Admitting: *Deleted

## 2021-03-06 ENCOUNTER — Telehealth: Payer: Self-pay | Admitting: *Deleted

## 2021-03-06 DIAGNOSIS — I77819 Aortic ectasia, unspecified site: Secondary | ICD-10-CM

## 2021-03-06 NOTE — Telephone Encounter (Signed)
Spoke with patient aware for results and verbalized understanding with recommendations. Patient aware a phone call will be made to him to schedule CT Angio and afterwards will schedule lab BMET appointment.

## 2021-03-06 NOTE — Telephone Encounter (Signed)
-----   Message from [place], [place] sent at 03/04/2021  8:55 AM EST ----- Echo looks OK.  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  Nothing to do about either, though would recommend getting a baseline CT scan to monitor annually his aorta.  Please order CT angio chest to evaluate aortic dilation.

## 2021-03-10 DIAGNOSIS — R7301 Impaired fasting glucose: Secondary | ICD-10-CM | POA: Diagnosis not present

## 2021-03-14 ENCOUNTER — Other Ambulatory Visit: Payer: Medicare Other

## 2021-03-15 DIAGNOSIS — Z0000 Encounter for general adult medical examination without abnormal findings: Secondary | ICD-10-CM | POA: Diagnosis not present

## 2021-03-16 ENCOUNTER — Other Ambulatory Visit: Payer: Medicare Other

## 2021-03-16 ENCOUNTER — Other Ambulatory Visit: Payer: Self-pay

## 2021-03-16 ENCOUNTER — Ambulatory Visit (INDEPENDENT_AMBULATORY_CARE_PROVIDER_SITE_OTHER): Payer: Medicare Other

## 2021-03-16 DIAGNOSIS — I441 Atrioventricular block, second degree: Secondary | ICD-10-CM | POA: Diagnosis not present

## 2021-03-16 DIAGNOSIS — I77819 Aortic ectasia, unspecified site: Secondary | ICD-10-CM | POA: Diagnosis not present

## 2021-03-16 LAB — CUP PACEART INCLINIC DEVICE CHECK
Battery Remaining Longevity: 137 mo
Battery Voltage: 3.2 V
Brady Statistic AP VP Percent: 3.75 %
Brady Statistic AP VS Percent: 0 %
Brady Statistic AS VP Percent: 95.77 %
Brady Statistic AS VS Percent: 0.49 %
Brady Statistic RA Percent Paced: 3.83 %
Brady Statistic RV Percent Paced: 99.49 %
Date Time Interrogation Session: 20230119152853
Implantable Lead Implant Date: 20230105
Implantable Lead Implant Date: 20230105
Implantable Lead Location: 753859
Implantable Lead Location: 753860
Implantable Lead Model: 5076
Implantable Lead Model: 5076
Implantable Pulse Generator Implant Date: 20230105
Lead Channel Impedance Value: 361 Ohm
Lead Channel Impedance Value: 418 Ohm
Lead Channel Impedance Value: 475 Ohm
Lead Channel Impedance Value: 513 Ohm
Lead Channel Pacing Threshold Amplitude: 0.375 V
Lead Channel Pacing Threshold Amplitude: 0.5 V
Lead Channel Pacing Threshold Pulse Width: 0.4 ms
Lead Channel Pacing Threshold Pulse Width: 0.4 ms
Lead Channel Sensing Intrinsic Amplitude: 12.75 mV
Lead Channel Sensing Intrinsic Amplitude: 3.5 mV
Lead Channel Sensing Intrinsic Amplitude: 4.125 mV
Lead Channel Sensing Intrinsic Amplitude: 7.875 mV
Lead Channel Setting Pacing Amplitude: 3.5 V
Lead Channel Setting Pacing Amplitude: 3.5 V
Lead Channel Setting Pacing Pulse Width: 0.4 ms
Lead Channel Setting Sensing Sensitivity: 0.9 mV

## 2021-03-16 NOTE — Patient Instructions (Signed)

## 2021-03-16 NOTE — Progress Notes (Signed)
Wound check appointment. Steri-strips removed. Wound without redness or edema. Incision edges approximated, wound well healed. Normal device function. Thresholds, sensing, and impedances consistent with implant measurements. Device programmed at 3.5V/auto capture programmed on for extra safety margin until 3 month visit. Histogram distribution appropriate for patient and level of activity. AT/AF burden 0.2%, EGM's appear AT with longest in duration 15 minutes. No high ventricular rates noted. Patient educated about wound care, arm mobility. ROV in 3 months with implanting physician.

## 2021-03-17 LAB — BASIC METABOLIC PANEL
BUN/Creatinine Ratio: 17 (ref 10–24)
BUN: 16 mg/dL (ref 8–27)
CO2: 23 mmol/L (ref 20–29)
Calcium: 9.4 mg/dL (ref 8.6–10.2)
Chloride: 101 mmol/L (ref 96–106)
Creatinine, Ser: 0.95 mg/dL (ref 0.76–1.27)
Glucose: 122 mg/dL — ABNORMAL HIGH (ref 70–99)
Potassium: 4.9 mmol/L (ref 3.5–5.2)
Sodium: 137 mmol/L (ref 134–144)
eGFR: 79 mL/min/{1.73_m2} (ref >59)

## 2021-03-20 ENCOUNTER — Other Ambulatory Visit: Payer: Self-pay

## 2021-03-20 ENCOUNTER — Ambulatory Visit (INDEPENDENT_AMBULATORY_CARE_PROVIDER_SITE_OTHER)
Admission: RE | Admit: 2021-03-20 | Discharge: 2021-03-20 | Disposition: A | Discharge status: Home / Self Care | Payer: Medicare Other | Source: Ambulatory Visit | Attending: Physician Assistant | Admitting: Physician Assistant

## 2021-03-20 DIAGNOSIS — I77819 Aortic ectasia, unspecified site: Secondary | ICD-10-CM

## 2021-03-20 DIAGNOSIS — J439 Emphysema, unspecified: Secondary | ICD-10-CM | POA: Diagnosis not present

## 2021-03-20 DIAGNOSIS — I7121 Aneurysm of the ascending aorta, without rupture: Secondary | ICD-10-CM | POA: Diagnosis not present

## 2021-03-20 DIAGNOSIS — K449 Diaphragmatic hernia without obstruction or gangrene: Secondary | ICD-10-CM | POA: Diagnosis not present

## 2021-03-20 MED ORDER — IOHEXOL 350 MG/ML SOLN
100.0000 mL | Freq: Once | INTRAVENOUS | Status: AC | PRN
  Administered 2021-03-20: 100 mL via INTRAVENOUS

## 2021-03-23 ENCOUNTER — Telehealth: Payer: Self-pay | Admitting: Physician Assistant

## 2021-03-23 NOTE — Telephone Encounter (Signed)
Follow Up:     Patient says he would like to talk to [facility] or  the nurse. He would like to have someone to go over his test results that was on My Chart. He also wants to know what is the next step of treatment for him please.

## 2021-03-27 ENCOUNTER — Telehealth: Payer: Self-pay | Admitting: Physician Assistant

## 2021-03-27 NOTE — Telephone Encounter (Signed)
Called patient to send remote transmission.   Patient reports of palpiations and elevated heart rate at times when he is walking around or standing up. Denies any other symptoms. States he has become more active walking over the week but does not feel his symptoms during his more active times.  Presenting rhythm 90 bpm. 3 VT events logged, 2 fast A&V events logged. Appear (1:1) with longest in duration 1 minute 14 seconds.  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 Please see EGM below.   Advised patient to call back if symptoms increase or worsen. Advised someone will call back with update. Patient verbalized understanding.

## 2021-03-27 NOTE — Telephone Encounter (Signed)
Called patient to address his questions, though he states that the RN he spoke to last week answered the questions he had. He does mention that he has noticed spikes in his HR 150's or so, that are fairly brief and settle quickly, no particular associated symptoms, but aware of it. I advised that I would ask one of our device clinic RNs to call him and request a transmission, look into his deice transmission to better try and identify what these are.  He was appreciative  Jodelle Green

## 2021-03-28 NOTE — Telephone Encounter (Signed)
"  Agreed!  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    If he is having a lot of this, or symptoms/bothersome to him,  add a small dose of Toprol XL 25mg  daily to simmer that down and have him come in to see one of [place] in a few weeks.   Thanks  Renee"  Unsuccessful telephone encounter to follow up on patient's request to speak to [facility] and to advise on recommendations to begin Toprol XL if he is symptomatic with episodes. Unfortunately unable to leave message on either given number. Will attempt to call back at later time.

## 2021-03-28 NOTE — Telephone Encounter (Signed)
Patient is calling requesting to speak with Daryl Mitchell in regards to his transmission sent in yesterday.

## 2021-03-29 NOTE — Telephone Encounter (Signed)
Spoke with pt.  Advised of Renee's recommendation.  Explained Metoprolol is Beta Blocker.  Patient indicated that the current episodes have not been that frequent so he would like to opt for wait and see right now.  He will call back if he should decide to start Metoprolol.

## 2021-03-29 NOTE — Telephone Encounter (Signed)
Follow Up:     Patient is calling back to find out the results and what is the next step please?

## 2021-04-06 ENCOUNTER — Telehealth: Payer: Self-pay

## 2021-04-06 MED ORDER — METOPROLOL SUCCINATE ER 25 MG PO TB24: 25.0000 mg | ORAL_TABLET | Freq: Every day | ORAL | 2 refills | Status: DC

## 2021-04-06 NOTE — Telephone Encounter (Signed)
Pt reports more frequent episodes of SVT.  He would like to start Metoprolol XL 25mg  as recommended by Elouise Munroe, PA (See 03/23/21 phone encounter).    Rx sent to pharmacy as requested.

## 2021-04-06 NOTE — Telephone Encounter (Signed)
The patient left a message stating he is experiencing rapid heart rates.   He has several questions for the nurse. I asked him to send a manual transmission for the nurse to review. Transmission received 04/06/2021.

## 2021-04-12 DIAGNOSIS — M19049 Primary osteoarthritis, unspecified hand: Secondary | ICD-10-CM | POA: Diagnosis not present

## 2021-05-09 DIAGNOSIS — H401113 Primary open-angle glaucoma, right eye, severe stage: Secondary | ICD-10-CM | POA: Diagnosis not present

## 2021-05-09 DIAGNOSIS — H401121 Primary open-angle glaucoma, left eye, mild stage: Secondary | ICD-10-CM | POA: Diagnosis not present

## 2021-05-09 DIAGNOSIS — Z961 Presence of intraocular lens: Secondary | ICD-10-CM | POA: Diagnosis not present

## 2021-05-09 DIAGNOSIS — H1045 Other chronic allergic conjunctivitis: Secondary | ICD-10-CM | POA: Diagnosis not present

## 2021-05-09 DIAGNOSIS — H0102A Squamous blepharitis right eye, upper and lower eyelids: Secondary | ICD-10-CM | POA: Diagnosis not present

## 2021-05-09 DIAGNOSIS — H0102B Squamous blepharitis left eye, upper and lower eyelids: Secondary | ICD-10-CM | POA: Diagnosis not present

## 2021-06-02 ENCOUNTER — Ambulatory Visit (INDEPENDENT_AMBULATORY_CARE_PROVIDER_SITE_OTHER): Payer: Medicare Other

## 2021-06-02 DIAGNOSIS — I441 Atrioventricular block, second degree: Secondary | ICD-10-CM | POA: Diagnosis not present

## 2021-06-05 LAB — CUP PACEART REMOTE DEVICE CHECK
Battery Remaining Longevity: 112 mo
Battery Voltage: 3.14 V
Brady Statistic AP VP Percent: 24.71 %
Brady Statistic AP VS Percent: 0.06 %
Brady Statistic AS VP Percent: 74.69 %
Brady Statistic AS VS Percent: 0.54 %
Brady Statistic RA Percent Paced: 25 %
Brady Statistic RV Percent Paced: 99.41 %
Date Time Interrogation Session: 20230406213509
Implantable Lead Implant Date: 20230105
Implantable Lead Implant Date: 20230105
Implantable Lead Location: 753859
Implantable Lead Location: 753860
Implantable Lead Model: 5076
Implantable Lead Model: 5076
Implantable Pulse Generator Implant Date: 20230105
Lead Channel Impedance Value: 323 Ohm
Lead Channel Impedance Value: 380 Ohm
Lead Channel Impedance Value: 399 Ohm
Lead Channel Impedance Value: 513 Ohm
Lead Channel Pacing Threshold Amplitude: 0.5 V
Lead Channel Pacing Threshold Amplitude: 0.625 V
Lead Channel Pacing Threshold Pulse Width: 0.4 ms
Lead Channel Pacing Threshold Pulse Width: 0.4 ms
Lead Channel Sensing Intrinsic Amplitude: 2.125 mV
Lead Channel Sensing Intrinsic Amplitude: 2.125 mV
Lead Channel Sensing Intrinsic Amplitude: 4.375 mV
Lead Channel Sensing Intrinsic Amplitude: 4.375 mV
Lead Channel Setting Pacing Amplitude: 3.25 V
Lead Channel Setting Pacing Amplitude: 3.25 V
Lead Channel Setting Pacing Pulse Width: 0.4 ms
Lead Channel Setting Sensing Sensitivity: 0.9 mV

## 2021-06-13 ENCOUNTER — Encounter: Payer: Self-pay | Admitting: Cardiology

## 2021-06-13 ENCOUNTER — Other Ambulatory Visit: Payer: Self-pay

## 2021-06-13 ENCOUNTER — Ambulatory Visit: Payer: Medicare Other | Admitting: Cardiology

## 2021-06-13 VITALS — BP 92/70 | HR 65 | Ht 68.0 in | Wt 169.8 lb

## 2021-06-13 DIAGNOSIS — I441 Atrioventricular block, second degree: Secondary | ICD-10-CM | POA: Diagnosis not present

## 2021-06-13 MED ORDER — METOPROLOL SUCCINATE ER 25 MG PO TB24: 25.0000 mg | ORAL_TABLET | Freq: Every day | ORAL | 3 refills | Status: DC

## 2021-06-13 NOTE — Progress Notes (Signed)
? ?Electrophysiology Office Note ? ? ?Date:  06/13/2021  ? ?ID:  CLYDE ZARRELLA, DOB [birth year], MRN [id number] ? ?PCP:  Lawerance Cruel, MD  ?Cardiologist:   ?Primary Electrophysiologist:  Cortina Vultaggio Meredith Leeds, MD   ? ?Chief Complaint: pacemaker ?  ?History of Present Illness: ?Daryl Mitchell is a 86 y.o. male who is being seen today for the evaluation of pacemaker at the request of Lawerance Cruel, MD. Presenting today for electrophysiology evaluation. ? ?He has a history significant for hypertension.  He presented to the hospital 03/02/2021 with fatigue and weakness and was found to be in 2-1 AV block.  He is status post dual-chamber pacemaker implanted 03/02/2021.  Was found to have SVT episodes on his pacemaker.  He was started on metoprolol without further episodes. ? ?Today, he denies symptoms of palpitations, chest pain, shortness of breath, orthopnea, PND, lower extremity edema, claudication, dizziness, presyncope, syncope, bleeding, or neurologic sequela. The patient is tolerating medications without difficulties.  ? ? ?Past Medical History:  ?Diagnosis Date  ? Arthritis   ? Cancer [facility])   ? Basal and squamous cell carcinoma, Dr.Gruber  ? Diverticulosis   ? GERD (gastroesophageal reflux disease)   ? Hyperlipidemia   ? Nocturia   ? Peripheral neuropathy   ? ?Past Surgical History:  ?Procedure Laterality Date  ? COLONOSCOPY  08/27/2012  ? PACEMAKER IMPLANT N/A 03/02/2021  ? Procedure: PACEMAKER IMPLANT;  Surgeon: Constance Haw, MD;  Location: [facility];  Service: Cardiovascular;  Laterality: N/A;  ? skin cancer removal    ? ? ? ?Current Outpatient Medications  ?Medication Sig Dispense Refill  ? B Complex Vitamins (B COMPLEX 1 PO) Take 1 tablet by mouth daily.    ? Biotin 5 MG CAPS Take 1 capsule by mouth daily.    ? dorzolamide-timolol (COSOPT) 22.3-6.8 MG/ML ophthalmic solution Place 1 drop into both eyes 2 (two) times daily.    ? Multiple Vitamin (MULTIVITAMIN) tablet Take 1 tablet by mouth  daily.    ? VYZULTA 0.024 % SOLN Apply 1 drop to eye at bedtime.    ? metoprolol succinate (TOPROL XL) 25 MG 24 hr tablet Take 1 tablet (25 mg total) by mouth daily. 90 tablet 3  ? ?No current facility-administered medications for this visit.  ? ? ?Allergies:   Codeine and Penicillins  ? ?Social History:  The patient  reports that he has quit smoking. His smoking use included cigarettes. He does not have any smokeless tobacco history on file. He reports current alcohol use. He reports that he does not use drugs.  ? ?Family History:  The patient's family history includes Diabetes in his brother; Heart failure in his mother; Hypothyroidism in his son.  ? ? ?ROS:  Please see the history of present illness.   Otherwise, review of systems is positive for none.   All other systems are reviewed and negative.  ? ? ?PHYSICAL EXAM: ?VS:  BP 92/70   Pulse 65   Ht '5\' 8"'$  (1.727 m)   Wt 169 lb 12.8 oz (77 kg)   SpO2 95%   BMI 25.82 kg/m?  , BMI Body mass index is 25.82 kg/m?. ?GEN: Well nourished, well developed, in no acute distress  ?HEENT: normal  ?Neck: no JVD, carotid bruits, or masses ?Cardiac: RRR; no murmurs, rubs, or gallops,no edema  ?Respiratory:  clear to auscultation bilaterally, normal work of breathing ?GI: soft, nontender, nondistended, + BS ?MS: no deformity or atrophy  ?Skin: warm and  dry, device pocket is well healed ?Neuro:  Strength and sensation are intact ?Psych: euthymic mood, full affect ? ?EKG:  EKG is ordered today. ?Personal review of the ekg ordered shows this rhythm, rate 65 ? ?Device interrogation is reviewed today in detail.  See PaceArt for details. ? ? ?Recent Labs: ?03/02/2021: ALT 11; B Natriuretic Peptide 123.3; Hemoglobin 14.3; Magnesium 2.0; Platelets 202; TSH 2.854 ?03/16/2021: BUN 16; Creatinine, Ser 0.95; Potassium 4.9; Sodium 137  ? ? ?Lipid Panel  ?No results found for: CHOL, TRIG, HDL, CHOLHDL, VLDL, LDLCALC, LDLDIRECT ? ? ?Wt Readings from Last 3 Encounters:  ?06/13/21 169 lb 12.8  oz (77 kg)  ?03/02/21 165 lb 2 oz (74.9 kg)  ?  ? ? ?Other studies Reviewed: ?Additional studies/ records that were reviewed today include: TTE 03/03/21  ?Review of the above records today demonstrates:  ? 1. Left ventricular ejection fraction, by estimation, is 55 to 60%. The  ?left ventricle has normal function. The left ventricle has no regional  ?wall motion abnormalities. Left ventricular diastolic parameters are  ?consistent with Grade I diastolic  ?dysfunction (impaired relaxation).  ? 2. Right ventricular systolic function is normal. The right ventricular  ?size is normal. There is normal pulmonary artery systolic pressure.  ? 3. The mitral valve is normal in structure. Trivial mitral valve  ?regurgitation. No evidence of mitral stenosis. Moderate mitral annular  ?calcification.  ? 4. The aortic valve is calcified. There is mild calcification of the  ?aortic valve. There is mild thickening of the aortic valve. Aortic valve  ?regurgitation is mild to moderate. Aortic valve sclerosis is present, with  ?no evidence of aortic valve  ?stenosis. Aortic regurgitation PHT measures 432 msec.  ? 5. Aortic dilatation noted. There is mild dilatation of the ascending  ?aorta, measuring 42 mm. There is moderate dilatation of the aortic root,  ?measuring 46 mm.  ? 6. The inferior vena cava is normal in size with greater than 50%  ?respiratory variability, suggesting right atrial pressure of 3 mmHg.  ? ? ?ASSESSMENT AND PLAN: ? ?1.  2 1 AV block: Status post Medtronic dual-chamber pacemaker planted 03/02/2021.  Device functioning appropriately.  No changes at this time. ? ?2.  SVT: Found on device interrogation.  He was mildly symptomatic.  Has been started on metoprolol without further episodes.  We Cooper Stamp continue with current management. ? ?Current medicines are reviewed at length with the patient today.   ?The patient does not have concerns regarding his medicines.  The following changes were made today:  none ? ?Labs/ tests  ordered today include:  ?Orders Placed This Encounter  ?Procedures  ? EKG 12-Lead  ? ? ? ?Disposition:   FU with Mickayla Trouten 9 months ? ?Signed, ?Daanish Copes Meredith Leeds, MD  ?06/13/2021 3:19 PM    ? ?[facility] ?[address] ?[address] ?[place] [address] ?[phone] (office) ?[phone] (fax) ? ?

## 2021-06-13 NOTE — Patient Instructions (Signed)
Medication Instructions:  ?Your physician recommends that you continue on your current medications as directed. Please refer to the Current Medication list given to you today. ? ?*If you need a refill on your cardiac medications before your next appointment, please call your pharmacy* ? ? ?Lab Work: ?None ordered ? ? ?Testing/Procedures: ?None ordered ? ? ?Follow-Up: ?At [facility], you and your health needs are our priority.  As part of our continuing mission to provide you with exceptional heart care, we have created designated Provider Care Teams.  These Care Teams include your primary Cardiologist (physician) and Advanced Practice Providers (APPs -  Physician Assistants and Nurse Practitioners) who all work together to provide you with the care you need, when you need it. ? ?Remote monitoring is used to monitor your Pacemaker or ICD from home. This monitoring reduces the number of office visits required to check your device to one time per year. It allows [place] to keep an eye on the functioning of your device to ensure it is working properly. You are scheduled for a device check from home on 09/01/2021. You may send your transmission at any time that day. If you have a wireless device, the transmission will be sent automatically. After your physician reviews your transmission, you will receive a postcard with your next transmission date. ? ?Your next appointment:   ?9 month(s) ? ?The format for your next appointment:   ?In Person ? ?Provider:   ?You will see one of the following Advanced Practice Providers on your designated Care Team:   ?Tommye Standard, PA-C ?Legrand Como "Jonni Sanger" [place], PA-C ? ? ?Thank you for choosing [facility]!! ? ? ?Trinidad Curet, RN ?([phone] ? ?

## 2021-06-13 NOTE — Progress Notes (Signed)
Opened in error

## 2021-06-15 DIAGNOSIS — M13849 Other specified arthritis, unspecified hand: Secondary | ICD-10-CM | POA: Diagnosis not present

## 2021-06-15 DIAGNOSIS — M79645 Pain in left finger(s): Secondary | ICD-10-CM | POA: Diagnosis not present

## 2021-06-15 DIAGNOSIS — M79644 Pain in right finger(s): Secondary | ICD-10-CM | POA: Diagnosis not present

## 2021-06-19 NOTE — Progress Notes (Signed)
Remote pacemaker transmission.   

## 2021-07-12 DIAGNOSIS — W57XXXA Bitten or stung by nonvenomous insect and other nonvenomous arthropods, initial encounter: Secondary | ICD-10-CM | POA: Diagnosis not present

## 2021-07-12 DIAGNOSIS — L299 Pruritus, unspecified: Secondary | ICD-10-CM | POA: Diagnosis not present

## 2021-08-14 DIAGNOSIS — D692 Other nonthrombocytopenic purpura: Secondary | ICD-10-CM | POA: Diagnosis not present

## 2021-08-14 DIAGNOSIS — L821 Other seborrheic keratosis: Secondary | ICD-10-CM | POA: Diagnosis not present

## 2021-08-14 DIAGNOSIS — L570 Actinic keratosis: Secondary | ICD-10-CM | POA: Diagnosis not present

## 2021-08-14 DIAGNOSIS — Z85828 Personal history of other malignant neoplasm of skin: Secondary | ICD-10-CM | POA: Diagnosis not present

## 2021-08-14 DIAGNOSIS — Z85820 Personal history of malignant melanoma of skin: Secondary | ICD-10-CM | POA: Diagnosis not present

## 2021-08-14 DIAGNOSIS — D225 Melanocytic nevi of trunk: Secondary | ICD-10-CM | POA: Diagnosis not present

## 2021-09-01 ENCOUNTER — Ambulatory Visit (INDEPENDENT_AMBULATORY_CARE_PROVIDER_SITE_OTHER): Payer: Medicare Other

## 2021-09-01 DIAGNOSIS — I441 Atrioventricular block, second degree: Secondary | ICD-10-CM | POA: Diagnosis not present

## 2021-09-02 LAB — CUP PACEART REMOTE DEVICE CHECK
Battery Remaining Longevity: 153 mo
Battery Voltage: 3.12 V
Brady Statistic AP VP Percent: 1.12 %
Brady Statistic AP VS Percent: 39.81 %
Brady Statistic AS VP Percent: 2.26 %
Brady Statistic AS VS Percent: 56.81 %
Brady Statistic RA Percent Paced: 40.84 %
Brady Statistic RV Percent Paced: 3.39 %
Date Time Interrogation Session: 20230707015057
Implantable Lead Implant Date: 20230105
Implantable Lead Implant Date: 20230105
Implantable Lead Location: 753859
Implantable Lead Location: 753860
Implantable Lead Model: 5076
Implantable Lead Model: 5076
Implantable Pulse Generator Implant Date: 20230105
Lead Channel Impedance Value: 323 Ohm
Lead Channel Impedance Value: 361 Ohm
Lead Channel Impedance Value: 380 Ohm
Lead Channel Impedance Value: 494 Ohm
Lead Channel Pacing Threshold Amplitude: 0.5 V
Lead Channel Pacing Threshold Amplitude: 0.625 V
Lead Channel Pacing Threshold Pulse Width: 0.4 ms
Lead Channel Pacing Threshold Pulse Width: 0.4 ms
Lead Channel Sensing Intrinsic Amplitude: 2.75 mV
Lead Channel Sensing Intrinsic Amplitude: 2.75 mV
Lead Channel Sensing Intrinsic Amplitude: 4.75 mV
Lead Channel Sensing Intrinsic Amplitude: 4.75 mV
Lead Channel Setting Pacing Amplitude: 1.5 V
Lead Channel Setting Pacing Amplitude: 2 V
Lead Channel Setting Pacing Pulse Width: 0.4 ms
Lead Channel Setting Sensing Sensitivity: 0.9 mV

## 2021-09-11 ENCOUNTER — Telehealth: Payer: Self-pay | Admitting: Cardiology

## 2021-09-11 NOTE — Telephone Encounter (Signed)
Patient called to get the report from his pacemaker transmission 09/01/21

## 2021-09-12 DIAGNOSIS — Z961 Presence of intraocular lens: Secondary | ICD-10-CM | POA: Diagnosis not present

## 2021-09-12 DIAGNOSIS — H0102A Squamous blepharitis right eye, upper and lower eyelids: Secondary | ICD-10-CM | POA: Diagnosis not present

## 2021-09-12 DIAGNOSIS — H0015 Chalazion left lower eyelid: Secondary | ICD-10-CM | POA: Diagnosis not present

## 2021-09-12 DIAGNOSIS — H0102B Squamous blepharitis left eye, upper and lower eyelids: Secondary | ICD-10-CM | POA: Diagnosis not present

## 2021-09-12 DIAGNOSIS — H401113 Primary open-angle glaucoma, right eye, severe stage: Secondary | ICD-10-CM | POA: Diagnosis not present

## 2021-09-12 DIAGNOSIS — H1045 Other chronic allergic conjunctivitis: Secondary | ICD-10-CM | POA: Diagnosis not present

## 2021-09-12 NOTE — Telephone Encounter (Signed)
Normal device function. Patient called and updated. Appreciative for call.

## 2021-09-18 NOTE — Progress Notes (Signed)
Remote pacemaker transmission.   

## 2021-10-20 ENCOUNTER — Ambulatory Visit: Payer: Self-pay

## 2021-12-01 ENCOUNTER — Ambulatory Visit (INDEPENDENT_AMBULATORY_CARE_PROVIDER_SITE_OTHER): Payer: Medicare Other

## 2021-12-01 DIAGNOSIS — I441 Atrioventricular block, second degree: Secondary | ICD-10-CM | POA: Diagnosis not present

## 2021-12-04 LAB — CUP PACEART REMOTE DEVICE CHECK
Battery Remaining Longevity: 153 mo
Battery Voltage: 3.07 V
Brady Statistic AP VP Percent: 4.19 %
Brady Statistic AP VS Percent: 30.71 %
Brady Statistic AS VP Percent: 14.03 %
Brady Statistic AS VS Percent: 51.06 %
Brady Statistic RA Percent Paced: 33.85 %
Brady Statistic RV Percent Paced: 18.24 %
Date Time Interrogation Session: 20231006081433
Implantable Lead Implant Date: 20230105
Implantable Lead Implant Date: 20230105
Implantable Lead Location: 753859
Implantable Lead Location: 753860
Implantable Lead Model: 5076
Implantable Lead Model: 5076
Implantable Pulse Generator Implant Date: 20230105
Lead Channel Impedance Value: 304 Ohm
Lead Channel Impedance Value: 361 Ohm
Lead Channel Impedance Value: 380 Ohm
Lead Channel Impedance Value: 475 Ohm
Lead Channel Pacing Threshold Amplitude: 0.5 V
Lead Channel Pacing Threshold Amplitude: 0.75 V
Lead Channel Pacing Threshold Pulse Width: 0.4 ms
Lead Channel Pacing Threshold Pulse Width: 0.4 ms
Lead Channel Sensing Intrinsic Amplitude: 4.375 mV
Lead Channel Sensing Intrinsic Amplitude: 4.375 mV
Lead Channel Sensing Intrinsic Amplitude: 4.625 mV
Lead Channel Sensing Intrinsic Amplitude: 4.625 mV
Lead Channel Setting Pacing Amplitude: 1.5 V
Lead Channel Setting Pacing Amplitude: 2 V
Lead Channel Setting Pacing Pulse Width: 0.4 ms
Lead Channel Setting Sensing Sensitivity: 0.9 mV

## 2021-12-05 NOTE — Progress Notes (Signed)
Remote pacemaker transmission.   

## 2022-01-16 DIAGNOSIS — H1045 Other chronic allergic conjunctivitis: Secondary | ICD-10-CM | POA: Diagnosis not present

## 2022-01-16 DIAGNOSIS — H0102A Squamous blepharitis right eye, upper and lower eyelids: Secondary | ICD-10-CM | POA: Diagnosis not present

## 2022-01-16 DIAGNOSIS — H0102B Squamous blepharitis left eye, upper and lower eyelids: Secondary | ICD-10-CM | POA: Diagnosis not present

## 2022-01-16 DIAGNOSIS — Z961 Presence of intraocular lens: Secondary | ICD-10-CM | POA: Diagnosis not present

## 2022-01-16 DIAGNOSIS — H401113 Primary open-angle glaucoma, right eye, severe stage: Secondary | ICD-10-CM | POA: Diagnosis not present

## 2022-02-02 DIAGNOSIS — R059 Cough, unspecified: Secondary | ICD-10-CM | POA: Diagnosis not present

## 2022-02-02 DIAGNOSIS — R0602 Shortness of breath: Secondary | ICD-10-CM | POA: Diagnosis not present

## 2022-02-02 DIAGNOSIS — Z20822 Contact with and (suspected) exposure to covid-19: Secondary | ICD-10-CM | POA: Diagnosis not present

## 2022-02-07 DIAGNOSIS — H0102B Squamous blepharitis left eye, upper and lower eyelids: Secondary | ICD-10-CM | POA: Diagnosis not present

## 2022-02-07 DIAGNOSIS — Z961 Presence of intraocular lens: Secondary | ICD-10-CM | POA: Diagnosis not present

## 2022-02-07 DIAGNOSIS — H1045 Other chronic allergic conjunctivitis: Secondary | ICD-10-CM | POA: Diagnosis not present

## 2022-02-07 DIAGNOSIS — H0102A Squamous blepharitis right eye, upper and lower eyelids: Secondary | ICD-10-CM | POA: Diagnosis not present

## 2022-02-07 DIAGNOSIS — H04561 Stenosis of right lacrimal punctum: Secondary | ICD-10-CM | POA: Diagnosis not present

## 2022-02-07 DIAGNOSIS — H401113 Primary open-angle glaucoma, right eye, severe stage: Secondary | ICD-10-CM | POA: Diagnosis not present

## 2022-02-13 DIAGNOSIS — H61001 Unspecified perichondritis of right external ear: Secondary | ICD-10-CM | POA: Diagnosis not present

## 2022-02-13 DIAGNOSIS — L570 Actinic keratosis: Secondary | ICD-10-CM | POA: Diagnosis not present

## 2022-02-13 DIAGNOSIS — L821 Other seborrheic keratosis: Secondary | ICD-10-CM | POA: Diagnosis not present

## 2022-02-13 DIAGNOSIS — C44321 Squamous cell carcinoma of skin of nose: Secondary | ICD-10-CM | POA: Diagnosis not present

## 2022-02-13 DIAGNOSIS — D485 Neoplasm of uncertain behavior of skin: Secondary | ICD-10-CM | POA: Diagnosis not present

## 2022-02-13 DIAGNOSIS — Z85828 Personal history of other malignant neoplasm of skin: Secondary | ICD-10-CM | POA: Diagnosis not present

## 2022-02-13 DIAGNOSIS — D1801 Hemangioma of skin and subcutaneous tissue: Secondary | ICD-10-CM | POA: Diagnosis not present

## 2022-03-02 ENCOUNTER — Ambulatory Visit (INDEPENDENT_AMBULATORY_CARE_PROVIDER_SITE_OTHER): Payer: Medicare Other

## 2022-03-02 DIAGNOSIS — I441 Atrioventricular block, second degree: Secondary | ICD-10-CM | POA: Diagnosis not present

## 2022-03-02 LAB — CUP PACEART REMOTE DEVICE CHECK
Battery Remaining Longevity: 151 mo
Battery Voltage: 3.05 V
Brady Statistic AP VP Percent: 6.66 %
Brady Statistic AP VS Percent: 19.59 %
Brady Statistic AS VP Percent: 22.28 %
Brady Statistic AS VS Percent: 51.47 %
Brady Statistic RA Percent Paced: 25.35 %
Brady Statistic RV Percent Paced: 28.94 %
Date Time Interrogation Session: 20240104232850
Implantable Lead Connection Status: 753985
Implantable Lead Connection Status: 753985
Implantable Lead Implant Date: 20230105
Implantable Lead Implant Date: 20230105
Implantable Lead Location: 753859
Implantable Lead Location: 753860
Implantable Lead Model: 5076
Implantable Lead Model: 5076
Implantable Pulse Generator Implant Date: 20230105
Lead Channel Impedance Value: 304 Ohm
Lead Channel Impedance Value: 361 Ohm
Lead Channel Impedance Value: 399 Ohm
Lead Channel Impedance Value: 475 Ohm
Lead Channel Pacing Threshold Amplitude: 0.5 V
Lead Channel Pacing Threshold Amplitude: 0.5 V
Lead Channel Pacing Threshold Pulse Width: 0.4 ms
Lead Channel Pacing Threshold Pulse Width: 0.4 ms
Lead Channel Sensing Intrinsic Amplitude: 4.125 mV
Lead Channel Sensing Intrinsic Amplitude: 4.125 mV
Lead Channel Sensing Intrinsic Amplitude: 4.375 mV
Lead Channel Sensing Intrinsic Amplitude: 4.375 mV
Lead Channel Setting Pacing Amplitude: 1.5 V
Lead Channel Setting Pacing Amplitude: 2 V
Lead Channel Setting Pacing Pulse Width: 0.4 ms
Lead Channel Setting Sensing Sensitivity: 0.9 mV
Zone Setting Status: 755011
Zone Setting Status: 755011

## 2022-03-13 DIAGNOSIS — M545 Low back pain, unspecified: Secondary | ICD-10-CM | POA: Diagnosis not present

## 2022-03-14 ENCOUNTER — Other Ambulatory Visit: Payer: Self-pay | Admitting: Family Medicine

## 2022-03-14 ENCOUNTER — Ambulatory Visit
Admission: RE | Admit: 2022-03-14 | Discharge: 2022-03-14 | Disposition: A | Discharge status: Home / Self Care | Payer: Medicare Other | Source: Ambulatory Visit | Attending: Family Medicine | Admitting: Family Medicine

## 2022-03-14 DIAGNOSIS — M545 Low back pain, unspecified: Secondary | ICD-10-CM

## 2022-03-14 DIAGNOSIS — R7301 Impaired fasting glucose: Secondary | ICD-10-CM | POA: Diagnosis not present

## 2022-03-14 DIAGNOSIS — R5383 Other fatigue: Secondary | ICD-10-CM | POA: Diagnosis not present

## 2022-03-19 NOTE — Progress Notes (Signed)
Remote pacemaker transmission.   

## 2022-03-20 DIAGNOSIS — Z0000 Encounter for general adult medical examination without abnormal findings: Secondary | ICD-10-CM | POA: Diagnosis not present

## 2022-03-20 DIAGNOSIS — I719 Aortic aneurysm of unspecified site, without rupture: Secondary | ICD-10-CM | POA: Diagnosis not present

## 2022-03-20 DIAGNOSIS — R634 Abnormal weight loss: Secondary | ICD-10-CM | POA: Diagnosis not present

## 2022-03-20 DIAGNOSIS — I700 Atherosclerosis of aorta: Secondary | ICD-10-CM | POA: Diagnosis not present

## 2022-03-20 DIAGNOSIS — M545 Low back pain, unspecified: Secondary | ICD-10-CM | POA: Diagnosis not present

## 2022-04-04 DIAGNOSIS — M47816 Spondylosis without myelopathy or radiculopathy, lumbar region: Secondary | ICD-10-CM | POA: Diagnosis not present

## 2022-04-05 NOTE — Progress Notes (Signed)
    Electrophysiology Office Note Date: 04/06/2022  ID:  Daryl Mitchell, DOB Jul 16, 1935, MRN [id number]  PCP: Lawerance Cruel, MD Primary Cardiologist: None Electrophysiologist: Will Meredith Leeds, MD   CC: Pacemaker follow-up  Daryl Mitchell is a 87 y.o. male seen today for Will Meredith Leeds, MD for routine electrophysiology followup. Since last being seen in our clinic the patient reports doing very well from a cardiac perspective. Is follow up with ortho for back pain.  he denies chest pain, palpitations, dyspnea, PND, orthopnea, nausea, vomiting, dizziness, syncope, edema, weight gain, or early satiety.   Device History: Medtronic Dual Chamber PPM implanted 02/2021 for Second degree AV block  Past Medical History:  Diagnosis Date   Arthritis    Cancer ([place])    Basal and squamous cell carcinoma, Dr.Gruber   Diverticulosis    GERD (gastroesophageal reflux disease)    Hyperlipidemia    Nocturia    Peripheral neuropathy     Current Outpatient Medications  Medication Instructions   B Complex Vitamins (B COMPLEX 1 PO) 1 tablet, Oral, Daily   dorzolamide-timolol (COSOPT) 22.3-6.8 MG/ML ophthalmic solution 1 drop, Both Eyes, 2 times daily   escitalopram (LEXAPRO) 5 mg, Oral, Daily   Multiple Vitamin (MULTIVITAMIN) tablet 1 tablet, Oral, Daily   VYZULTA 0.024 % SOLN 1 drop, Ophthalmic, Daily at bedtime    Family History: Family History  Problem Relation Age of Onset   Heart failure Mother    Diabetes Brother    Hypothyroidism Son     Physical Exam: Vitals:   04/06/22 1033  BP: 124/80  Pulse: 75  SpO2: 96%  Weight: 164 lb (74.4 kg)  Height: '5\' 7"'$  (1.702 m)     GEN- NAD. A&O x 3. Normal affect HEENT: Normocephalic, atraumatic Lungs- CTAB, Normal effort.  Heart- Regular rate and rhythm rate and rhythm. No M/G/R.  Extremities- No peripheral edema. no clubbing or cyanosis Skin- warm and dry, no rash or lesion, PPM pocket well healed.  PPM Interrogation-   reviewed in detail today,  See PACEART report.  EKG is ordered today. Personal review shows NSR at 74 bpm, with occasional PVCs  Other studies Reviewed: Additional studies/ records that were reviewed today include: Previous EP office notes, Previous remote checks, Most recent labwork.   Assessment and Plan:  1. Second Degree AV block s/p Medtronic PPM  Normal PPM function See Pace Art report No changes today  2. SVT Follow by device.  Rare and limited symptoms.  Offered to add back toprol but deferred for now.   Current medicines are reviewed at length with the patient today.    Disposition:   Follow up with Dr. Curt Bears  in 9 months    Signed, Shirley Friar, PA-C  04/06/2022 10:40 AM  [facility] [place] [address] [phone] (office) [phone] (fax)

## 2022-04-06 ENCOUNTER — Ambulatory Visit: Payer: Medicare Other | Attending: Student | Admitting: Student

## 2022-04-06 VITALS — BP 124/80 | HR 75 | Ht 67.0 in | Wt 164.0 lb

## 2022-04-06 DIAGNOSIS — I471 Supraventricular tachycardia, unspecified: Secondary | ICD-10-CM | POA: Diagnosis not present

## 2022-04-06 DIAGNOSIS — I441 Atrioventricular block, second degree: Secondary | ICD-10-CM | POA: Diagnosis not present

## 2022-04-06 LAB — CUP PACEART INCLINIC DEVICE CHECK
Battery Remaining Longevity: 152 mo
Battery Voltage: 3.05 V
Brady Statistic AP VP Percent: 4.25 %
Brady Statistic AP VS Percent: 29.82 %
Brady Statistic AS VP Percent: 13.27 %
Brady Statistic AS VS Percent: 52.65 %
Brady Statistic RA Percent Paced: 33.34 %
Brady Statistic RV Percent Paced: 17.53 %
Date Time Interrogation Session: 20240209105052
Implantable Lead Connection Status: 753985
Implantable Lead Connection Status: 753985
Implantable Lead Implant Date: 20230105
Implantable Lead Implant Date: 20230105
Implantable Lead Location: 753859
Implantable Lead Location: 753860
Implantable Lead Model: 5076
Implantable Lead Model: 5076
Implantable Pulse Generator Implant Date: 20230105
Lead Channel Impedance Value: 342 Ohm
Lead Channel Impedance Value: 380 Ohm
Lead Channel Impedance Value: 456 Ohm
Lead Channel Impedance Value: 532 Ohm
Lead Channel Pacing Threshold Amplitude: 0.5 V
Lead Channel Pacing Threshold Amplitude: 0.625 V
Lead Channel Pacing Threshold Pulse Width: 0.4 ms
Lead Channel Pacing Threshold Pulse Width: 0.4 ms
Lead Channel Sensing Intrinsic Amplitude: 3.125 mV
Lead Channel Sensing Intrinsic Amplitude: 4 mV
Lead Channel Sensing Intrinsic Amplitude: 4.875 mV
Lead Channel Sensing Intrinsic Amplitude: 5.25 mV
Lead Channel Setting Pacing Amplitude: 1.5 V
Lead Channel Setting Pacing Amplitude: 2 V
Lead Channel Setting Pacing Pulse Width: 0.4 ms
Lead Channel Setting Sensing Sensitivity: 0.9 mV
Zone Setting Status: 755011
Zone Setting Status: 755011

## 2022-04-06 NOTE — Patient Instructions (Addendum)
Medication Instructions:  Your physician recommends that you continue on your current medications as directed. Please refer to the Current Medication list given to you today.  *If you need a refill on your cardiac medications before your next appointment, please call your pharmacy*  Lab Work: None ordered  Testing/Procedures: None ordered  Follow-Up: At [facility], you and your health needs are our priority.  As part of our continuing mission to provide you with exceptional heart care, we have created designated Provider Care Teams.  These Care Teams include your primary Cardiologist (physician) and Advanced Practice Providers (APPs -  Physician Assistants and Nurse Practitioners) who all work together to provide you with the care you need, when you need it.  Your next appointment:   9 month(s)  The format for your next appointment:   In Person  Provider:   Allegra Lai, MD{

## 2022-04-11 DIAGNOSIS — M47816 Spondylosis without myelopathy or radiculopathy, lumbar region: Secondary | ICD-10-CM | POA: Diagnosis not present

## 2022-04-13 DIAGNOSIS — M5451 Vertebrogenic low back pain: Secondary | ICD-10-CM | POA: Diagnosis not present

## 2022-04-16 DIAGNOSIS — M5451 Vertebrogenic low back pain: Secondary | ICD-10-CM | POA: Diagnosis not present

## 2022-04-18 DIAGNOSIS — M5451 Vertebrogenic low back pain: Secondary | ICD-10-CM | POA: Diagnosis not present

## 2022-04-23 DIAGNOSIS — M5451 Vertebrogenic low back pain: Secondary | ICD-10-CM | POA: Diagnosis not present

## 2022-05-10 ENCOUNTER — Other Ambulatory Visit (HOSPITAL_COMMUNITY): Payer: Self-pay | Admitting: Family Medicine

## 2022-05-10 DIAGNOSIS — E46 Unspecified protein-calorie malnutrition: Secondary | ICD-10-CM | POA: Diagnosis not present

## 2022-05-10 DIAGNOSIS — I712 Thoracic aortic aneurysm, without rupture, unspecified: Secondary | ICD-10-CM | POA: Diagnosis not present

## 2022-05-10 DIAGNOSIS — R634 Abnormal weight loss: Secondary | ICD-10-CM | POA: Diagnosis not present

## 2022-05-10 DIAGNOSIS — Z950 Presence of cardiac pacemaker: Secondary | ICD-10-CM | POA: Diagnosis not present

## 2022-05-10 DIAGNOSIS — R109 Unspecified abdominal pain: Secondary | ICD-10-CM

## 2022-05-10 DIAGNOSIS — Z6824 Body mass index (BMI) 24.0-24.9, adult: Secondary | ICD-10-CM | POA: Diagnosis not present

## 2022-05-10 DIAGNOSIS — I700 Atherosclerosis of aorta: Secondary | ICD-10-CM | POA: Diagnosis not present

## 2022-05-10 DIAGNOSIS — R7303 Prediabetes: Secondary | ICD-10-CM | POA: Diagnosis not present

## 2022-05-15 DIAGNOSIS — M47816 Spondylosis without myelopathy or radiculopathy, lumbar region: Secondary | ICD-10-CM | POA: Diagnosis not present

## 2022-05-16 ENCOUNTER — Ambulatory Visit (HOSPITAL_COMMUNITY)
Admission: RE | Admit: 2022-05-16 | Discharge: 2022-05-16 | Disposition: A | Discharge status: Home / Self Care | Payer: Medicare Other | Source: Ambulatory Visit | Attending: Family Medicine | Admitting: Family Medicine

## 2022-05-16 DIAGNOSIS — R109 Unspecified abdominal pain: Secondary | ICD-10-CM | POA: Diagnosis not present

## 2022-05-16 DIAGNOSIS — I700 Atherosclerosis of aorta: Secondary | ICD-10-CM | POA: Diagnosis not present

## 2022-05-16 MED ORDER — IOHEXOL 350 MG/ML SOLN
75.0000 mL | Freq: Once | INTRAVENOUS | Status: AC | PRN
  Administered 2022-05-16: 75 mL via INTRAVENOUS

## 2022-05-30 DIAGNOSIS — S22080A Wedge compression fracture of T11-T12 vertebra, initial encounter for closed fracture: Secondary | ICD-10-CM | POA: Diagnosis not present

## 2022-06-01 ENCOUNTER — Ambulatory Visit (INDEPENDENT_AMBULATORY_CARE_PROVIDER_SITE_OTHER): Payer: Medicare Other

## 2022-06-01 ENCOUNTER — Other Ambulatory Visit (HOSPITAL_COMMUNITY): Payer: Self-pay | Admitting: Neurological Surgery

## 2022-06-01 DIAGNOSIS — I441 Atrioventricular block, second degree: Secondary | ICD-10-CM | POA: Diagnosis not present

## 2022-06-01 DIAGNOSIS — S22080A Wedge compression fracture of T11-T12 vertebra, initial encounter for closed fracture: Secondary | ICD-10-CM

## 2022-06-01 LAB — CUP PACEART REMOTE DEVICE CHECK
Battery Remaining Longevity: 148 mo
Battery Voltage: 3.04 V
Brady Statistic AP VP Percent: 7.07 %
Brady Statistic AP VS Percent: 34.83 %
Brady Statistic AS VP Percent: 20.84 %
Brady Statistic AS VS Percent: 37.26 %
Brady Statistic RA Percent Paced: 40.88 %
Brady Statistic RV Percent Paced: 27.93 %
Date Time Interrogation Session: 20240405002047
Implantable Lead Connection Status: 753985
Implantable Lead Connection Status: 753985
Implantable Lead Implant Date: 20230105
Implantable Lead Implant Date: 20230105
Implantable Lead Location: 753859
Implantable Lead Location: 753860
Implantable Lead Model: 5076
Implantable Lead Model: 5076
Implantable Pulse Generator Implant Date: 20230105
Lead Channel Impedance Value: 304 Ohm
Lead Channel Impedance Value: 361 Ohm
Lead Channel Impedance Value: 380 Ohm
Lead Channel Impedance Value: 456 Ohm
Lead Channel Pacing Threshold Amplitude: 0.5 V
Lead Channel Pacing Threshold Amplitude: 0.625 V
Lead Channel Pacing Threshold Pulse Width: 0.4 ms
Lead Channel Pacing Threshold Pulse Width: 0.4 ms
Lead Channel Sensing Intrinsic Amplitude: 3 mV
Lead Channel Sensing Intrinsic Amplitude: 3 mV
Lead Channel Sensing Intrinsic Amplitude: 4.875 mV
Lead Channel Sensing Intrinsic Amplitude: 4.875 mV
Lead Channel Setting Pacing Amplitude: 1.5 V
Lead Channel Setting Pacing Amplitude: 2 V
Lead Channel Setting Pacing Pulse Width: 0.4 ms
Lead Channel Setting Sensing Sensitivity: 0.9 mV
Zone Setting Status: 755011
Zone Setting Status: 755011

## 2022-07-03 NOTE — Progress Notes (Signed)
Remote pacemaker transmission.   

## 2022-07-04 DIAGNOSIS — H0102B Squamous blepharitis left eye, upper and lower eyelids: Secondary | ICD-10-CM | POA: Diagnosis not present

## 2022-07-04 DIAGNOSIS — H401113 Primary open-angle glaucoma, right eye, severe stage: Secondary | ICD-10-CM | POA: Diagnosis not present

## 2022-07-04 DIAGNOSIS — H43812 Vitreous degeneration, left eye: Secondary | ICD-10-CM | POA: Diagnosis not present

## 2022-07-04 DIAGNOSIS — H1045 Other chronic allergic conjunctivitis: Secondary | ICD-10-CM | POA: Diagnosis not present

## 2022-07-04 DIAGNOSIS — Z961 Presence of intraocular lens: Secondary | ICD-10-CM | POA: Diagnosis not present

## 2022-07-04 DIAGNOSIS — H0102A Squamous blepharitis right eye, upper and lower eyelids: Secondary | ICD-10-CM | POA: Diagnosis not present

## 2022-07-04 DIAGNOSIS — H04561 Stenosis of right lacrimal punctum: Secondary | ICD-10-CM | POA: Diagnosis not present

## 2022-07-31 DIAGNOSIS — H903 Sensorineural hearing loss, bilateral: Secondary | ICD-10-CM | POA: Diagnosis not present

## 2022-08-03 DIAGNOSIS — H905 Unspecified sensorineural hearing loss: Secondary | ICD-10-CM | POA: Diagnosis not present

## 2022-08-16 DIAGNOSIS — L821 Other seborrheic keratosis: Secondary | ICD-10-CM | POA: Diagnosis not present

## 2022-08-16 DIAGNOSIS — C4441 Basal cell carcinoma of skin of scalp and neck: Secondary | ICD-10-CM | POA: Diagnosis not present

## 2022-08-16 DIAGNOSIS — Z85820 Personal history of malignant melanoma of skin: Secondary | ICD-10-CM | POA: Diagnosis not present

## 2022-08-16 DIAGNOSIS — Z85828 Personal history of other malignant neoplasm of skin: Secondary | ICD-10-CM | POA: Diagnosis not present

## 2022-08-16 DIAGNOSIS — L905 Scar conditions and fibrosis of skin: Secondary | ICD-10-CM | POA: Diagnosis not present

## 2022-08-16 DIAGNOSIS — L570 Actinic keratosis: Secondary | ICD-10-CM | POA: Diagnosis not present

## 2022-08-31 ENCOUNTER — Ambulatory Visit: Payer: Medicare Other

## 2022-08-31 DIAGNOSIS — I441 Atrioventricular block, second degree: Secondary | ICD-10-CM

## 2022-08-31 LAB — CUP PACEART REMOTE DEVICE CHECK
Battery Remaining Longevity: 144 mo
Battery Voltage: 3.03 V
Brady Statistic AP VP Percent: 8.52 %
Brady Statistic AP VS Percent: 39.4 %
Brady Statistic AS VP Percent: 18.87 %
Brady Statistic AS VS Percent: 33.21 %
Brady Statistic RA Percent Paced: 47.19 %
Brady Statistic RV Percent Paced: 27.42 %
Date Time Interrogation Session: 20240705011221
Implantable Lead Connection Status: 753985
Implantable Lead Connection Status: 753985
Implantable Lead Implant Date: 20230105
Implantable Lead Implant Date: 20230105
Implantable Lead Location: 753859
Implantable Lead Location: 753860
Implantable Lead Model: 5076
Implantable Lead Model: 5076
Implantable Pulse Generator Implant Date: 20230105
Lead Channel Impedance Value: 285 Ohm
Lead Channel Impedance Value: 342 Ohm
Lead Channel Impedance Value: 342 Ohm
Lead Channel Impedance Value: 437 Ohm
Lead Channel Pacing Threshold Amplitude: 0.5 V
Lead Channel Pacing Threshold Amplitude: 0.625 V
Lead Channel Pacing Threshold Pulse Width: 0.4 ms
Lead Channel Pacing Threshold Pulse Width: 0.4 ms
Lead Channel Sensing Intrinsic Amplitude: 3.75 mV
Lead Channel Sensing Intrinsic Amplitude: 3.75 mV
Lead Channel Sensing Intrinsic Amplitude: 4.125 mV
Lead Channel Sensing Intrinsic Amplitude: 4.125 mV
Lead Channel Setting Pacing Amplitude: 1.5 V
Lead Channel Setting Pacing Amplitude: 2 V
Lead Channel Setting Pacing Pulse Width: 0.4 ms
Lead Channel Setting Sensing Sensitivity: 0.9 mV
Zone Setting Status: 755011
Zone Setting Status: 755011

## 2022-09-18 NOTE — Progress Notes (Signed)
Remote pacemaker transmission.   

## 2022-11-02 IMAGING — CT CT ANGIO CHEST
3 of 9 series · 18 of 46 positions shown · IV contrast (OMNIPAQUE 350)
Comparison: None.

CLINICAL DATA: 85-year-old male with history dilated thoracic aorta
on echocardiogram.

EXAM:
CT ANGIOGRAPHY CHEST WITH CONTRAST
TECHNIQUE: Multidetector CT imaging of the chest was performed using the
standard protocol during bolus administration of intravenous
contrast. Multiplanar CT image reconstructions and MIPs were
obtained to evaluate the vascular anatomy.
CONTRAST:  100 mL Omnipaque 350, intravenous

[Series 4: aorta 3.0 bf37 2 · axial · 0.74mm/px · z∈[-261,-15]mm · 12 of 98 slices shown]
[im 8/98  lung]
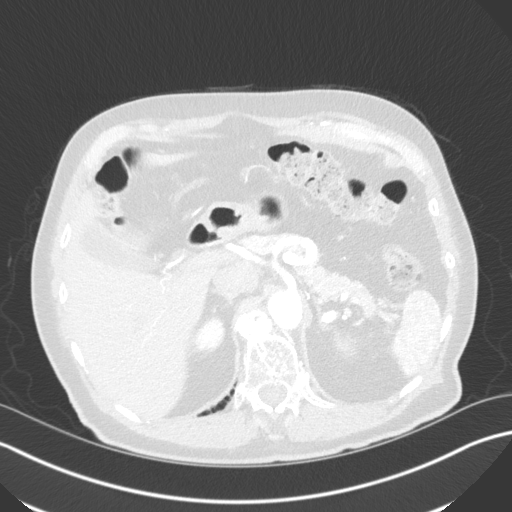
[im 15/98  soft-tissue]
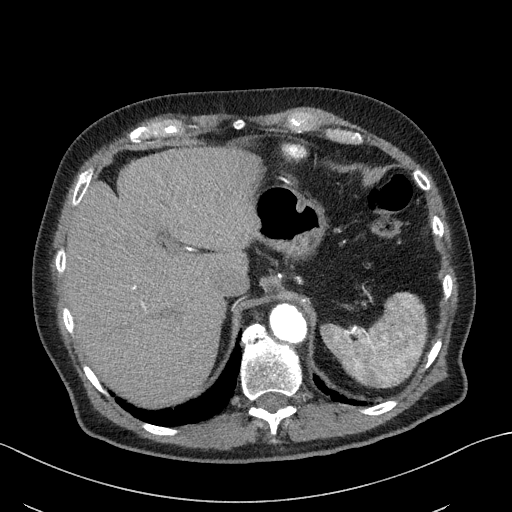
[im 23/98  lung]
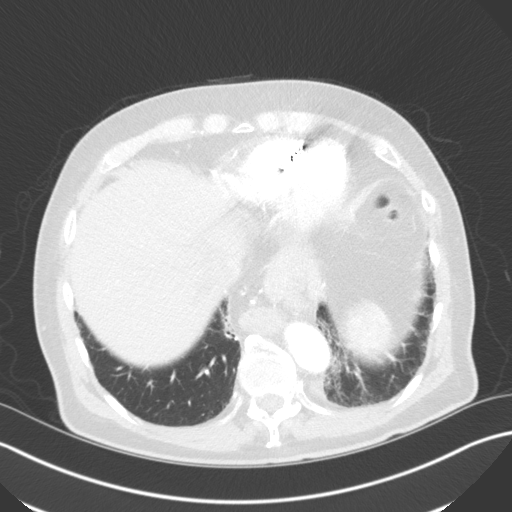
[im 30/98  soft-tissue]
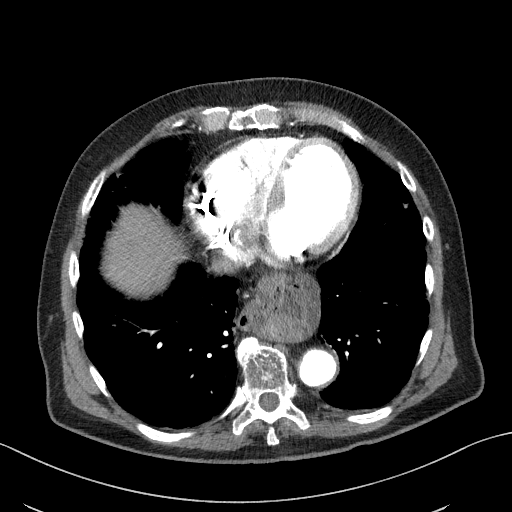
[im 38/98  lung]
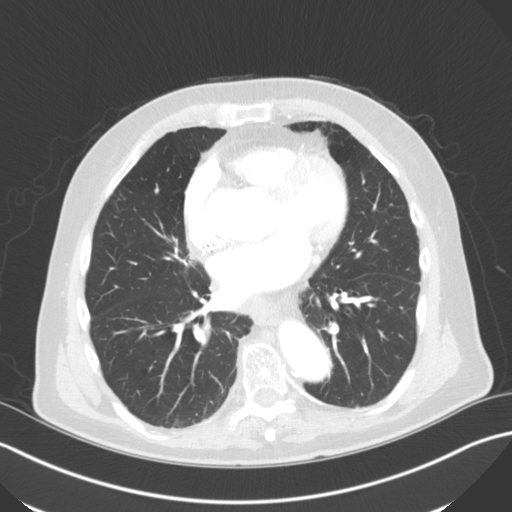
[im 45/98  soft-tissue]
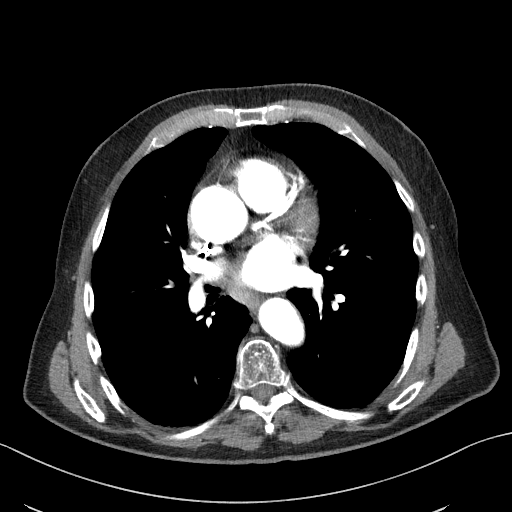
[im 53/98  lung]
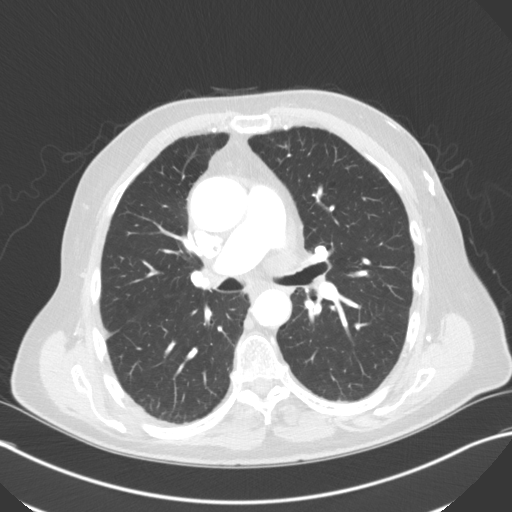
[im 60/98  soft-tissue]
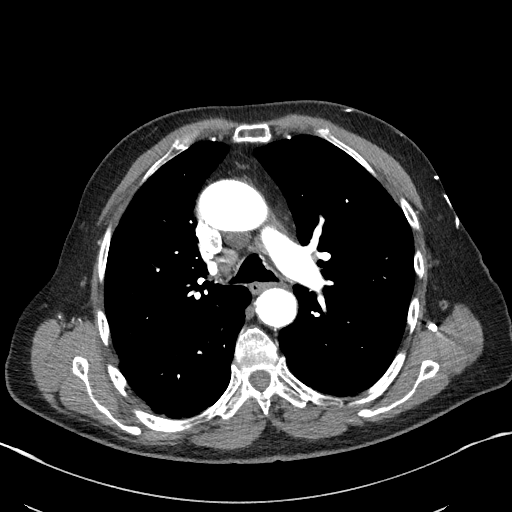
[im 68/98  lung]
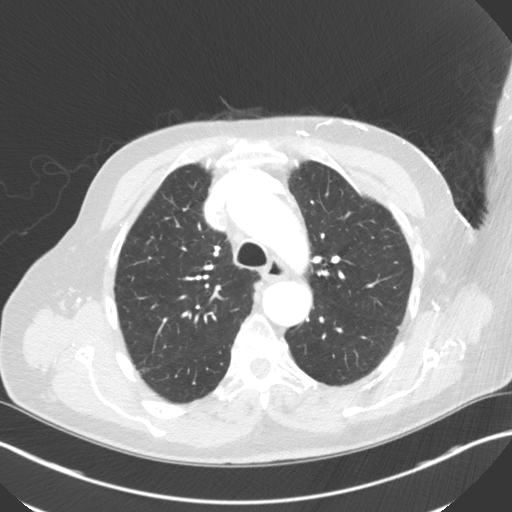
[im 75/98  soft-tissue]
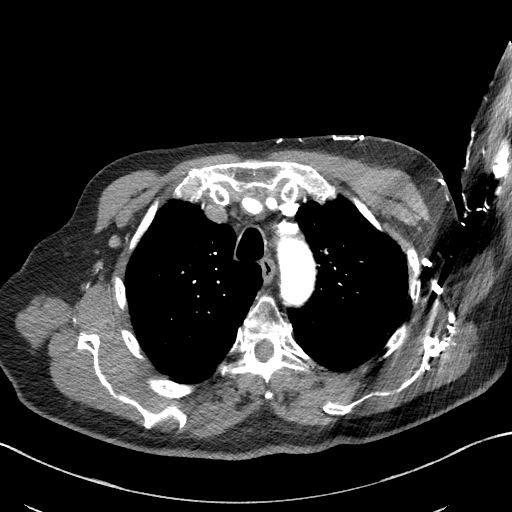
[im 83/98  lung]
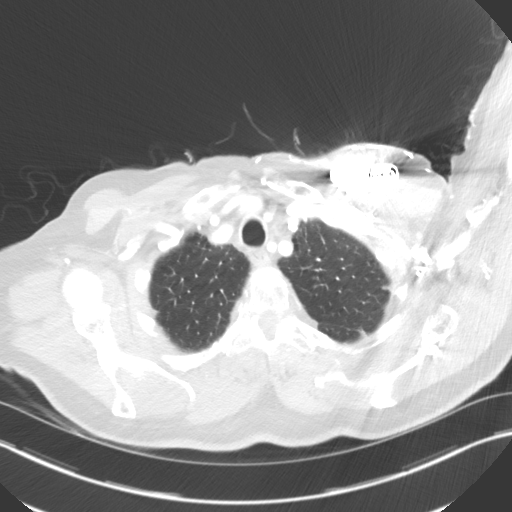
[im 90/98  soft-tissue]
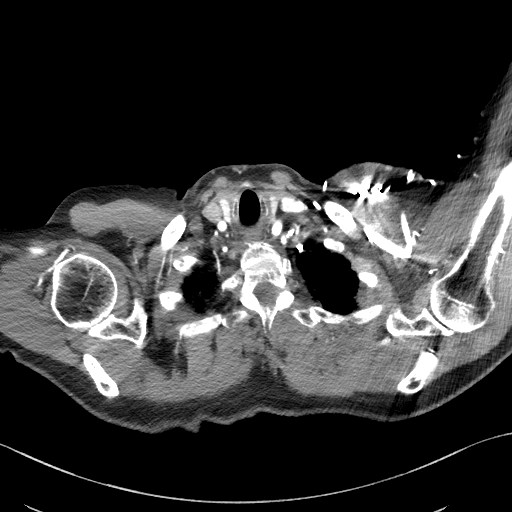

[Series 5: lung · axial · 0.74mm/px · z∈[-261,-195]mm · 3 of 98 slices shown]
[im 8/98  soft-tissue]
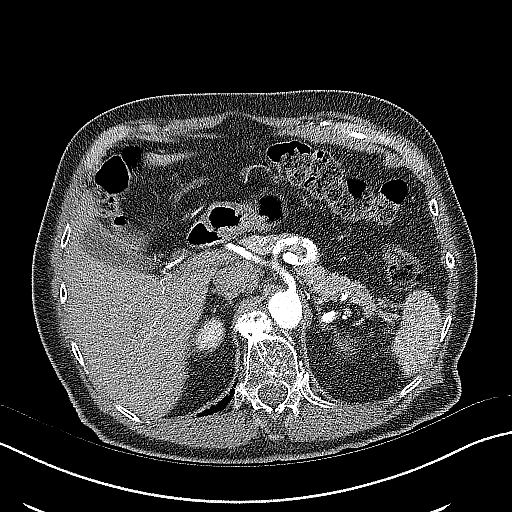
[im 23/98  soft-tissue]
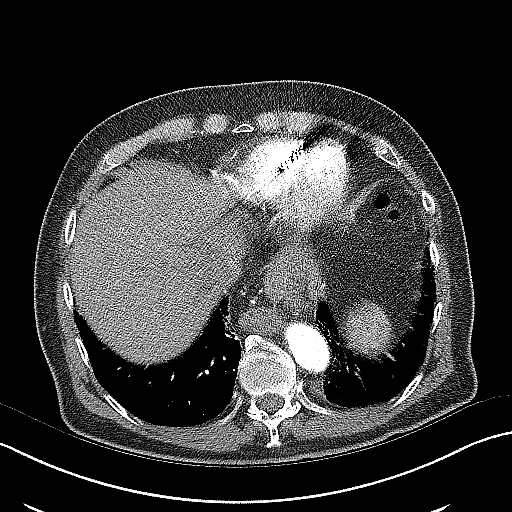
[im 30/98  soft-tissue]
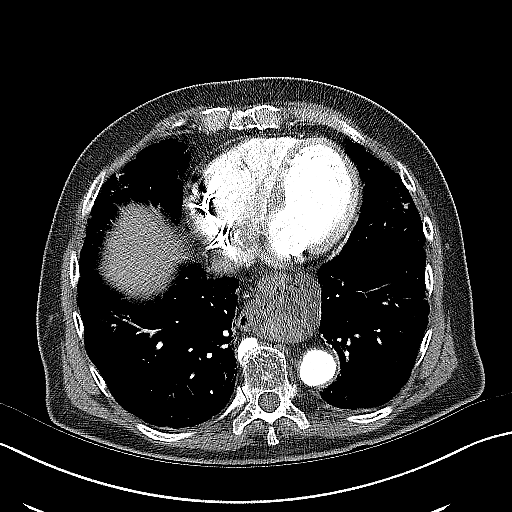

[Series 7: coronals · coronal · 0.58mm/px · 3 of 131 slices shown]
[im 33/131  soft-tissue]
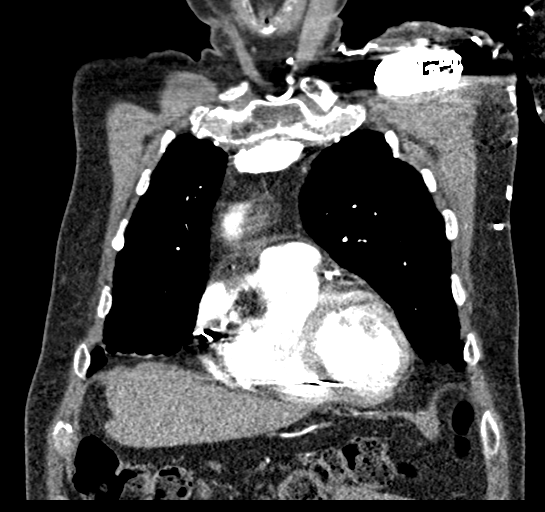
[im 66/131  soft-tissue]
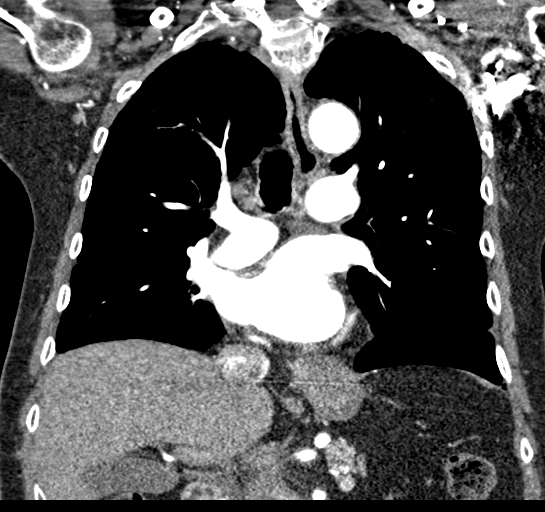
[im 98/131  soft-tissue]
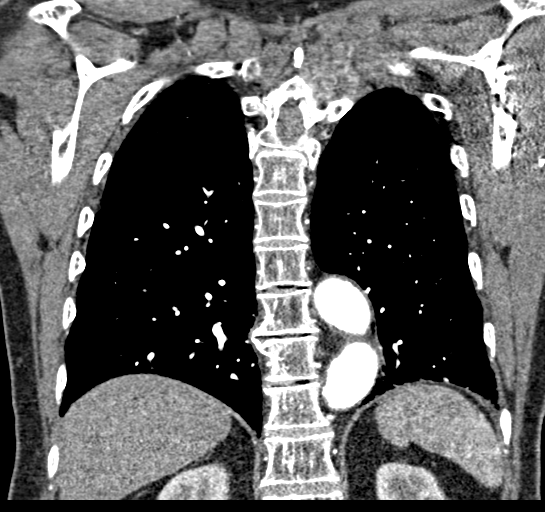

[18 of 46 positions shown; findings below may reference images not displayed]

FINDINGS: Cardiovascular: Preferential opacification of the thoracic aorta. No
evidence of thoracic aortic dissection. Normal heart size. Left
chest wall, subclavian vein approach dual lead pacemaker in place
with leads in the right atrium and right ventricle, as expected.
Coarse mitral annular calcifications. No pericardial effusion.

Sinues of Valsalva: 39 mm 37 x 35 mm

Sinotubular Junction: 34 mm

Ascending Aorta: 40 mm

Aortic Arch: 32 mm

Descending aorta: 25 mm at the level of the carina. Tortuous with
scattered atherosclerotic calcifications.

Branch vessels: Conventional branching pattern. Mild ostial stenosis
of the left subclavian artery secondary to atherosclerotic plaque.

Coronary arteries: Normal origins and courses. Severe
atherosclerotic calcifications.

Main pulmonary artery: 30 mm. No evidence of central pulmonary
embolism.

Pulmonary veins: Common origin of the left superior and inferior
pulmonary veins. No evidence of left atrial appendage thrombus.

Upper abdominal vasculature: Within normal limits.

Mediastinum/Nodes: No enlarged mediastinal, hilar, or axillary lymph
nodes. Thyroid gland, trachea, and esophagus demonstrate no
significant findings.

Lungs/Pleura: Mild bibasilar, lingular, and right middle lobe
subpleural reticular opacities. Apical scarring. Mild upper lobe
predominant centrilobular emphysema. No focal consolidation, pleural
effusion, or pneumothorax. No suspicious pulmonary nodules.

Upper Abdomen: Moderate hiatal hernia. The remaining visualized
upper abdomen is within normal limits.

Musculoskeletal: Mild multilevel degenerative changes of the
thoracic spine. No acute osseous abnormality.

Review of the MIP images confirms the above findings.
IMPRESSION: Vascular:

1. Fusiform ascending thoracic aortic aneurysm measuring up to
cm. Recommend annual imaging followup by CTA or MRA. This
recommendation follows 4323
ACCF/AHA/AATS/ACR/ASA/SCA/AUJLA/KORTO/SALONA/JAITEH Guidelines for the
Diagnosis and Management of Patients with Thoracic Aortic Disease.
Circulation. 4323; 121: E266-e369. Aortic aneurysm NOS (SSWQB-10Z.C)
2. Coronary and aortic atherosclerosis (SSWQB-IFE.E).
3. Coarse mitral annular calcifications.

Non-Vascular:

1. Moderate hiatal hernia without complicating features.
2. Emphysema (SSWQB-STS.Q) and chronic appearing bibasilar
subpleural scarring.

## 2022-11-14 DIAGNOSIS — H0102A Squamous blepharitis right eye, upper and lower eyelids: Secondary | ICD-10-CM | POA: Diagnosis not present

## 2022-11-14 DIAGNOSIS — Z961 Presence of intraocular lens: Secondary | ICD-10-CM | POA: Diagnosis not present

## 2022-11-14 DIAGNOSIS — H1045 Other chronic allergic conjunctivitis: Secondary | ICD-10-CM | POA: Diagnosis not present

## 2022-11-14 DIAGNOSIS — H43812 Vitreous degeneration, left eye: Secondary | ICD-10-CM | POA: Diagnosis not present

## 2022-11-14 DIAGNOSIS — H0102B Squamous blepharitis left eye, upper and lower eyelids: Secondary | ICD-10-CM | POA: Diagnosis not present

## 2022-11-14 DIAGNOSIS — H401113 Primary open-angle glaucoma, right eye, severe stage: Secondary | ICD-10-CM | POA: Diagnosis not present

## 2022-11-14 DIAGNOSIS — H04561 Stenosis of right lacrimal punctum: Secondary | ICD-10-CM | POA: Diagnosis not present

## 2022-11-26 DIAGNOSIS — L84 Corns and callosities: Secondary | ICD-10-CM | POA: Diagnosis not present

## 2022-11-26 DIAGNOSIS — S90222A Contusion of left lesser toe(s) with damage to nail, initial encounter: Secondary | ICD-10-CM | POA: Diagnosis not present

## 2022-11-26 DIAGNOSIS — M19071 Primary osteoarthritis, right ankle and foot: Secondary | ICD-10-CM | POA: Diagnosis not present

## 2022-11-26 DIAGNOSIS — I70203 Unspecified atherosclerosis of native arteries of extremities, bilateral legs: Secondary | ICD-10-CM | POA: Diagnosis not present

## 2022-12-03 ENCOUNTER — Ambulatory Visit (INDEPENDENT_AMBULATORY_CARE_PROVIDER_SITE_OTHER): Payer: Medicare Other

## 2022-12-03 DIAGNOSIS — I441 Atrioventricular block, second degree: Secondary | ICD-10-CM | POA: Diagnosis not present

## 2022-12-03 LAB — CUP PACEART REMOTE DEVICE CHECK
Battery Remaining Longevity: 141 mo
Battery Voltage: 3.02 V
Brady Statistic AP VP Percent: 8.83 %
Brady Statistic AP VS Percent: 42.43 %
Brady Statistic AS VP Percent: 18.71 %
Brady Statistic AS VS Percent: 30.03 %
Brady Statistic RA Percent Paced: 50.53 %
Brady Statistic RV Percent Paced: 27.57 %
Date Time Interrogation Session: 20241006220221
Implantable Lead Connection Status: 753985
Implantable Lead Connection Status: 753985
Implantable Lead Implant Date: 20230105
Implantable Lead Implant Date: 20230105
Implantable Lead Location: 753859
Implantable Lead Location: 753860
Implantable Lead Model: 5076
Implantable Lead Model: 5076
Implantable Pulse Generator Implant Date: 20230105
Lead Channel Impedance Value: 304 Ohm
Lead Channel Impedance Value: 361 Ohm
Lead Channel Impedance Value: 361 Ohm
Lead Channel Impedance Value: 437 Ohm
Lead Channel Pacing Threshold Amplitude: 0.5 V
Lead Channel Pacing Threshold Amplitude: 0.625 V
Lead Channel Pacing Threshold Pulse Width: 0.4 ms
Lead Channel Pacing Threshold Pulse Width: 0.4 ms
Lead Channel Sensing Intrinsic Amplitude: 3.625 mV
Lead Channel Sensing Intrinsic Amplitude: 3.625 mV
Lead Channel Sensing Intrinsic Amplitude: 3.75 mV
Lead Channel Sensing Intrinsic Amplitude: 3.75 mV
Lead Channel Setting Pacing Amplitude: 1.5 V
Lead Channel Setting Pacing Amplitude: 2 V
Lead Channel Setting Pacing Pulse Width: 0.4 ms
Lead Channel Setting Sensing Sensitivity: 0.9 mV
Zone Setting Status: 755011
Zone Setting Status: 755011

## 2022-12-17 NOTE — Progress Notes (Signed)
Remote pacemaker transmission.   

## 2023-01-10 ENCOUNTER — Encounter: Payer: Self-pay | Admitting: Cardiology

## 2023-01-10 ENCOUNTER — Ambulatory Visit: Payer: Medicare Other | Attending: Cardiology | Admitting: Cardiology

## 2023-01-10 VITALS — BP 118/78 | HR 87 | Ht 67.0 in | Wt 166.0 lb

## 2023-01-10 DIAGNOSIS — I471 Supraventricular tachycardia, unspecified: Secondary | ICD-10-CM

## 2023-01-10 DIAGNOSIS — I441 Atrioventricular block, second degree: Secondary | ICD-10-CM | POA: Diagnosis not present

## 2023-01-10 DIAGNOSIS — I10 Essential (primary) hypertension: Secondary | ICD-10-CM | POA: Diagnosis not present

## 2023-01-10 LAB — CUP PACEART INCLINIC DEVICE CHECK
Date Time Interrogation Session: 20241114172453
Implantable Lead Connection Status: 753985
Implantable Lead Connection Status: 753985
Implantable Lead Implant Date: 20230105
Implantable Lead Implant Date: 20230105
Implantable Lead Location: 753859
Implantable Lead Location: 753860
Implantable Lead Model: 5076
Implantable Lead Model: 5076
Implantable Pulse Generator Implant Date: 20230105

## 2023-01-10 NOTE — Progress Notes (Signed)
  Electrophysiology Office Note:   Date:  01/10/2023  ID:  Daryl Mitchell, DOB 10/28/1935, MRN [id number]  Primary Cardiologist: None Electrophysiologist: Dustee Bottenfield Jorja Loa, MD      History of Present Illness:   Daryl Mitchell is a 87 y.o. male with h/o hypertension, second-degree AV block seen today for routine electrophysiology followup.   Since last being seen in our clinic the patient reports doing.  He has no chest pain or shortness of breath.  He has been having quite a bit of anxiety due to social issues at home with his wife being ill.  Aside from that, he has no cardiac complaints.  he denies chest pain, palpitations, dyspnea, PND, orthopnea, nausea, vomiting, dizziness, syncope, edema, weight gain, or early satiety.   Review of systems complete and found to be negative unless listed in HPI.      EP Information / Studies Reviewed:    EKG is ordered today. Personal review as below.  EKG Interpretation Date/Time:  Thursday January 10 2023 16:20:20 EST Ventricular Rate:  87 PR Interval:  180 QRS Duration:  74 QT Interval:  364 QTC Calculation: 438 R Axis:   84  Text Interpretation: Sinus rhythm Premature ventricular complexes When compared with ECG of 03-Mar-2021 06:58, Sinus rhythm Confirmed by Zyquan Crotty ([id number]) on 01/10/2023 4:39:29 PM   PPM Interrogation-  reviewed in detail today,  See PACEART report.  Device History: Medtronic Dual Chamber PPM implanted 02/2021 for Second Degree AV block  Risk Assessment/Calculations:              Physical Exam:   VS:  BP 118/78 (BP Location: Left Arm, Patient Position: Sitting, Cuff Size: Normal)   Pulse 87   Ht 5\' 7"  (1.702 m)   Wt 166 lb (75.3 kg)   SpO2 95%   BMI 26.00 kg/m    Wt Readings from Last 3 Encounters:  01/10/23 166 lb (75.3 kg)  04/06/22 164 lb (74.4 kg)  06/13/21 169 lb 12.8 oz (77 kg)     GEN: Well nourished, well developed in no acute distress NECK: No JVD; No carotid bruits CARDIAC: Regular  rate and rhythm, no murmurs, rubs, gallops RESPIRATORY:  Clear to auscultation without rales, wheezing or rhonchi  ABDOMEN: Soft, non-tender, non-distended EXTREMITIES:  No edema; No deformity   ASSESSMENT AND PLAN:    Second Degree AV block s/p Medtronic PPM  Normal PPM function See Pace Art report No changes today  2.  Hypertension: well controlled  3.  SVT: Noted on device interrogation.  Rare and limited episodes.  Disposition:   Follow up with EP APP in 12 months  Signed, Dainel Arcidiacono Jorja Loa, MD

## 2023-02-07 DIAGNOSIS — L84 Corns and callosities: Secondary | ICD-10-CM | POA: Diagnosis not present

## 2023-02-07 DIAGNOSIS — B351 Tinea unguium: Secondary | ICD-10-CM | POA: Diagnosis not present

## 2023-02-07 DIAGNOSIS — I70203 Unspecified atherosclerosis of native arteries of extremities, bilateral legs: Secondary | ICD-10-CM | POA: Diagnosis not present

## 2023-02-07 DIAGNOSIS — S90222A Contusion of left lesser toe(s) with damage to nail, initial encounter: Secondary | ICD-10-CM | POA: Diagnosis not present

## 2023-03-05 ENCOUNTER — Ambulatory Visit (INDEPENDENT_AMBULATORY_CARE_PROVIDER_SITE_OTHER): Payer: Medicare Other

## 2023-03-05 DIAGNOSIS — I441 Atrioventricular block, second degree: Secondary | ICD-10-CM | POA: Diagnosis not present

## 2023-03-06 DIAGNOSIS — L905 Scar conditions and fibrosis of skin: Secondary | ICD-10-CM | POA: Diagnosis not present

## 2023-03-06 DIAGNOSIS — L570 Actinic keratosis: Secondary | ICD-10-CM | POA: Diagnosis not present

## 2023-03-06 DIAGNOSIS — Z85828 Personal history of other malignant neoplasm of skin: Secondary | ICD-10-CM | POA: Diagnosis not present

## 2023-03-06 DIAGNOSIS — Z85820 Personal history of malignant melanoma of skin: Secondary | ICD-10-CM | POA: Diagnosis not present

## 2023-03-06 DIAGNOSIS — L821 Other seborrheic keratosis: Secondary | ICD-10-CM | POA: Diagnosis not present

## 2023-03-06 DIAGNOSIS — D2261 Melanocytic nevi of right upper limb, including shoulder: Secondary | ICD-10-CM | POA: Diagnosis not present

## 2023-03-06 DIAGNOSIS — L820 Inflamed seborrheic keratosis: Secondary | ICD-10-CM | POA: Diagnosis not present

## 2023-03-06 LAB — CUP PACEART REMOTE DEVICE CHECK
Battery Remaining Longevity: 137 mo
Battery Voltage: 3.02 V
Brady Statistic AP VP Percent: 12.67 %
Brady Statistic AP VS Percent: 37.88 %
Brady Statistic AS VP Percent: 28.66 %
Brady Statistic AS VS Percent: 20.77 %
Brady Statistic RA Percent Paced: 49.95 %
Brady Statistic RV Percent Paced: 41.37 %
Date Time Interrogation Session: 20250107050250
Implantable Lead Connection Status: 753985
Implantable Lead Connection Status: 753985
Implantable Lead Implant Date: 20230105
Implantable Lead Implant Date: 20230105
Implantable Lead Location: 753859
Implantable Lead Location: 753860
Implantable Lead Model: 5076
Implantable Lead Model: 5076
Implantable Pulse Generator Implant Date: 20230105
Lead Channel Impedance Value: 304 Ohm
Lead Channel Impedance Value: 361 Ohm
Lead Channel Impedance Value: 380 Ohm
Lead Channel Impedance Value: 456 Ohm
Lead Channel Pacing Threshold Amplitude: 0.5 V
Lead Channel Pacing Threshold Amplitude: 0.625 V
Lead Channel Pacing Threshold Pulse Width: 0.4 ms
Lead Channel Pacing Threshold Pulse Width: 0.4 ms
Lead Channel Sensing Intrinsic Amplitude: 3.625 mV
Lead Channel Sensing Intrinsic Amplitude: 3.625 mV
Lead Channel Sensing Intrinsic Amplitude: 3.875 mV
Lead Channel Sensing Intrinsic Amplitude: 3.875 mV
Lead Channel Setting Pacing Amplitude: 1.5 V
Lead Channel Setting Pacing Amplitude: 2 V
Lead Channel Setting Pacing Pulse Width: 0.4 ms
Lead Channel Setting Sensing Sensitivity: 0.9 mV
Zone Setting Status: 755011
Zone Setting Status: 755011

## 2023-03-22 DIAGNOSIS — H0102B Squamous blepharitis left eye, upper and lower eyelids: Secondary | ICD-10-CM | POA: Diagnosis not present

## 2023-03-22 DIAGNOSIS — Z961 Presence of intraocular lens: Secondary | ICD-10-CM | POA: Diagnosis not present

## 2023-03-22 DIAGNOSIS — H04561 Stenosis of right lacrimal punctum: Secondary | ICD-10-CM | POA: Diagnosis not present

## 2023-03-22 DIAGNOSIS — H1045 Other chronic allergic conjunctivitis: Secondary | ICD-10-CM | POA: Diagnosis not present

## 2023-03-22 DIAGNOSIS — H43812 Vitreous degeneration, left eye: Secondary | ICD-10-CM | POA: Diagnosis not present

## 2023-03-22 DIAGNOSIS — H0102A Squamous blepharitis right eye, upper and lower eyelids: Secondary | ICD-10-CM | POA: Diagnosis not present

## 2023-03-22 DIAGNOSIS — H401113 Primary open-angle glaucoma, right eye, severe stage: Secondary | ICD-10-CM | POA: Diagnosis not present

## 2023-03-26 DIAGNOSIS — Z23 Encounter for immunization: Secondary | ICD-10-CM | POA: Diagnosis not present

## 2023-03-26 DIAGNOSIS — E7800 Pure hypercholesterolemia, unspecified: Secondary | ICD-10-CM | POA: Diagnosis not present

## 2023-03-26 DIAGNOSIS — R059 Cough, unspecified: Secondary | ICD-10-CM | POA: Diagnosis not present

## 2023-03-26 DIAGNOSIS — I700 Atherosclerosis of aorta: Secondary | ICD-10-CM | POA: Diagnosis not present

## 2023-03-26 DIAGNOSIS — Z0000 Encounter for general adult medical examination without abnormal findings: Secondary | ICD-10-CM | POA: Diagnosis not present

## 2023-03-26 DIAGNOSIS — I719 Aortic aneurysm of unspecified site, without rupture: Secondary | ICD-10-CM | POA: Diagnosis not present

## 2023-03-26 DIAGNOSIS — R7303 Prediabetes: Secondary | ICD-10-CM | POA: Diagnosis not present

## 2023-04-16 NOTE — Progress Notes (Signed)
 Remote pacemaker transmission.

## 2023-04-16 NOTE — Addendum Note (Signed)
Addended by: Geralyn Flash D on: 04/16/2023 02:09 PM   Modules accepted: Orders

## 2023-04-23 DIAGNOSIS — H02054 Trichiasis without entropian left upper eyelid: Secondary | ICD-10-CM | POA: Diagnosis not present

## 2023-05-08 DIAGNOSIS — L84 Corns and callosities: Secondary | ICD-10-CM | POA: Diagnosis not present

## 2023-05-08 DIAGNOSIS — S90222A Contusion of left lesser toe(s) with damage to nail, initial encounter: Secondary | ICD-10-CM | POA: Diagnosis not present

## 2023-05-08 DIAGNOSIS — B351 Tinea unguium: Secondary | ICD-10-CM | POA: Diagnosis not present

## 2023-05-08 DIAGNOSIS — I70203 Unspecified atherosclerosis of native arteries of extremities, bilateral legs: Secondary | ICD-10-CM | POA: Diagnosis not present

## 2023-06-04 ENCOUNTER — Encounter: Payer: Self-pay | Admitting: Pulmonary Disease

## 2023-06-04 ENCOUNTER — Ambulatory Visit (INDEPENDENT_AMBULATORY_CARE_PROVIDER_SITE_OTHER)

## 2023-06-04 ENCOUNTER — Ambulatory Visit: Payer: Medicare Other | Admitting: Pulmonary Disease

## 2023-06-04 VITALS — BP 123/78 | HR 85 | Ht 68.0 in | Wt 160.0 lb

## 2023-06-04 DIAGNOSIS — J984 Other disorders of lung: Secondary | ICD-10-CM

## 2023-06-04 DIAGNOSIS — R059 Cough, unspecified: Secondary | ICD-10-CM | POA: Diagnosis not present

## 2023-06-04 DIAGNOSIS — R053 Chronic cough: Secondary | ICD-10-CM

## 2023-06-04 DIAGNOSIS — R0989 Other specified symptoms and signs involving the circulatory and respiratory systems: Secondary | ICD-10-CM | POA: Diagnosis not present

## 2023-06-04 DIAGNOSIS — K449 Diaphragmatic hernia without obstruction or gangrene: Secondary | ICD-10-CM | POA: Diagnosis not present

## 2023-06-04 NOTE — Progress Notes (Signed)
 Synopsis: Referred in April 2024 for Cough   Subjective:   PATIENT ID: Daryl Mitchell GENDER: male DOB: 25-Oct-1935, MRN: [id number]   HPI  Chief Complaint  Patient presents with   Consult    Pt states that x3 months has been coughing up sputum all day , cloudy w/ pearl color    Daryl Mitchell is an 88 year old male, former smoker with history of GERD who is referred to pulmonary clinic for cough.   He has been experiencing a cough for approximately three months, initially producing mucus about the size of a marble every hour. Mucinex was effective in controlling the symptoms, but he discontinued it a week ago to avoid masking symptoms. Since stopping Mucinex, the cough has not been as severe. The cough does not wake him at night, but there is more mucus production in the morning, likely due to overnight accumulation.  No cold or respiratory infection at the onset of the cough, and it started before pollen season. He is unsure about sinus congestion or drainage but suspects it might be present. No wheezing, shortness of breath, fevers, chills, heartburn, or reflux. His appetite remains fine, and he has not experienced similar episodes in the past.  He quit smoking 36 years ago after smoking a pipe and cigarettes for several years. He has no significant dust or chemical exposures from his past work at [employer] or from hobbies.  He has been taking an antidepressant for the past six months, with a dosage increase three months ago, which he feels has been beneficial.  He mentions a recent increase in leg swelling noted by his doctor.   Past Medical History:  Diagnosis Date   Arthritis    Cancer (HCC)    Basal and squamous cell carcinoma, Dr.Gruber   Diverticulosis    GERD (gastroesophageal reflux disease)    Hyperlipidemia    Nocturia    Peripheral neuropathy      Family History  Problem Relation Age of Onset   Heart failure Mother    Diabetes Brother    Hypothyroidism Son       Social History   Socioeconomic History   Marital status: Married    Spouse name: Not on file   Number of children: Not on file   Years of education: Not on file   Highest education level: Not on file  Occupational History   Not on file  Tobacco Use   Smoking status: Former    Types: Cigarettes   Smokeless tobacco: Not on file  Substance and Sexual Activity   Alcohol use: Yes    Comment: 2 drinks qod   Drug use: No   Sexual activity: Not on file  Other Topics Concern   Not on file  Social History Narrative   Not on file   Social Drivers of Health   Financial Resource Strain: Not on file  Food Insecurity: No Food Insecurity (10/20/2021)   Hunger Vital Sign    Worried About Running Out of Food in the Last Year: Never true    Ran Out of Food in the Last Year: Never true  Transportation Needs: No Transportation Needs (10/20/2021)   PRAPARE - Administrator, Civil Service (Medical): No    Lack of Transportation (Non-Medical): No  Physical Activity: Not on file  Stress: Not on file  Social Connections: Not on file  Intimate Partner Violence: Not on file     Allergies  Allergen Reactions   Codeine Nausea  Only    Upset stomach   Penicillins Other (See Comments)    flushed face     Outpatient Medications Prior to Visit  Medication Sig Dispense Refill   B Complex Vitamins (B COMPLEX 1 PO) Take 1 tablet by mouth daily.     dorzolamide-timolol (COSOPT) 22.3-6.8 MG/ML ophthalmic solution Place 1 drop into both eyes 2 (two) times daily.     escitalopram (LEXAPRO) 5 MG tablet Take 5 mg by mouth daily.     Multiple Vitamin (MULTIVITAMIN) tablet Take 1 tablet by mouth daily.     VYZULTA 0.024 % SOLN Apply 1 drop to eye at bedtime.     No facility-administered medications prior to visit.    Review of Systems  Constitutional:  Negative for chills, fever, malaise/fatigue and weight loss.  HENT:  Negative for congestion, sinus pain and sore throat.   Eyes:  Negative.   Respiratory:  Positive for cough and sputum production. Negative for hemoptysis, shortness of breath and wheezing.   Cardiovascular:  Negative for chest pain, palpitations, orthopnea, claudication and leg swelling.  Gastrointestinal:  Negative for abdominal pain, heartburn, nausea and vomiting.  Genitourinary: Negative.   Musculoskeletal:  Negative for joint pain and myalgias.  Skin:  Negative for rash.  Neurological:  Negative for weakness.  Endo/Heme/Allergies: Negative.   Psychiatric/Behavioral: Negative.     Objective:   Vitals:   06/04/23 0929  BP: 123/78  Pulse: 85  SpO2: 94%  Weight: 160 lb (72.6 kg)  Height: 5\' 8"  (1.727 m)   Physical Exam Constitutional:      General: He is not in acute distress.    Appearance: Normal appearance.  Eyes:     General: No scleral icterus.    Conjunctiva/sclera: Conjunctivae normal.  Cardiovascular:     Rate and Rhythm: Normal rate and regular rhythm.  Pulmonary:     Breath sounds: No wheezing, rhonchi or rales.  Musculoskeletal:     Right lower leg: No edema.     Left lower leg: No edema.  Skin:    General: Skin is warm and dry.  Neurological:     General: No focal deficit present.    CBC    Component Value Date/Time   WBC 7.7 03/02/2021 1401   RBC 4.44 03/02/2021 1401   HGB 14.3 03/02/2021 1436   HCT 42.0 03/02/2021 1436   PLT 202 03/02/2021 1401   MCV 97.7 03/02/2021 1401   MCH 32.4 03/02/2021 1401   MCHC 33.2 03/02/2021 1401   RDW 12.3 03/02/2021 1401   LYMPHSABS 1.8 03/02/2021 1401   MONOABS 0.9 03/02/2021 1401   EOSABS 0.2 03/02/2021 1401   BASOSABS 0.1 03/02/2021 1401      Latest Ref Rng & Units 03/16/2021    2:50 PM 03/02/2021    2:36 PM 03/02/2021    2:01 PM  BMP  Glucose 70 - 99 mg/dL 409  811  914   BUN 8 - 27 mg/dL 16  21  17    Creatinine 0.76 - 1.27 mg/dL 7.82  9.56  2.13   BUN/Creat Ratio 10 - 24 17     Sodium 134 - 144 mmol/L 137  135  133   Potassium 3.5 - 5.2 mmol/L 4.9  5.0  4.7    Chloride 96 - 106 mmol/L 101  102  100   CO2 20 - 29 mmol/L 23   23   Calcium 8.6 - 10.2 mg/dL 9.4   9.1    Chest imaging: CXR 06/04/23 - personal  review Large hiatal hernia Increased Insterstitial prominence concerning for underlying ILD   PFT:     No data to display          Labs:  Path:  Echo:  Heart Catheterization:       Assessment & Plan:   Chronic cough - Plan: DG Chest 2 View  Scarring of lung - Plan: DG Chest 2 View  Discussion: Daryl Mitchell is an 88 year old male, former smoker with history of GERD who is referred to pulmonary clinic for cough.   Chronic cough with mucus production Chronic cough with mucus production for three months, improved after discontinuing Mucinex. Possible causes include interstitial lung disease or sinus drainage. - X-ray with increased interstitial markings - Prescribe Flonase nasal spray, one spray per nostril per day.  Interstitial lung disease Previous CT abdomen scan showed lung scarring of lower lobes, suggesting interstitial lung disease contributing to chronic cough. - Order chest CT scan to evaluate the increased interstitial   Melody Comas, MD [facility] Office: [phone]   Current Outpatient Medications:    B Complex Vitamins (B COMPLEX 1 PO), Take 1 tablet by mouth daily., Disp: , Rfl:    dorzolamide-timolol (COSOPT) 22.3-6.8 MG/ML ophthalmic solution, Place 1 drop into both eyes 2 (two) times daily., Disp: , Rfl:    escitalopram (LEXAPRO) 5 MG tablet, Take 5 mg by mouth daily., Disp: , Rfl:    Multiple Vitamin (MULTIVITAMIN) tablet, Take 1 tablet by mouth daily., Disp: , Rfl:    VYZULTA 0.024 % SOLN, Apply 1 drop to eye at bedtime., Disp: , Rfl:

## 2023-06-04 NOTE — Patient Instructions (Addendum)
 Your CT chest scan of the abdomen shows some scarring of the lower lungs.  We will check a chest x-ray today, if concerning for scarring we will order a CT Chest scan  Start fluticasone nasal spray for possible sinus drainage leading to cough and mucous production  Follow up in 2 months

## 2023-06-05 ENCOUNTER — Ambulatory Visit (INDEPENDENT_AMBULATORY_CARE_PROVIDER_SITE_OTHER): Payer: Medicare Other

## 2023-06-05 DIAGNOSIS — I441 Atrioventricular block, second degree: Secondary | ICD-10-CM

## 2023-06-05 LAB — CUP PACEART REMOTE DEVICE CHECK
Battery Remaining Longevity: 133 mo
Battery Voltage: 3.02 V
Brady Statistic AP VP Percent: 12.43 %
Brady Statistic AP VS Percent: 43.42 %
Brady Statistic AS VP Percent: 23.31 %
Brady Statistic AS VS Percent: 20.82 %
Brady Statistic RA Percent Paced: 55.3 %
Brady Statistic RV Percent Paced: 35.79 %
Date Time Interrogation Session: 20250408195408
Implantable Lead Connection Status: 753985
Implantable Lead Connection Status: 753985
Implantable Lead Implant Date: 20230105
Implantable Lead Implant Date: 20230105
Implantable Lead Location: 753859
Implantable Lead Location: 753860
Implantable Lead Model: 5076
Implantable Lead Model: 5076
Implantable Pulse Generator Implant Date: 20230105
Lead Channel Impedance Value: 304 Ohm
Lead Channel Impedance Value: 361 Ohm
Lead Channel Impedance Value: 361 Ohm
Lead Channel Impedance Value: 437 Ohm
Lead Channel Pacing Threshold Amplitude: 0.375 V
Lead Channel Pacing Threshold Amplitude: 0.75 V
Lead Channel Pacing Threshold Pulse Width: 0.4 ms
Lead Channel Pacing Threshold Pulse Width: 0.4 ms
Lead Channel Sensing Intrinsic Amplitude: 2.5 mV
Lead Channel Sensing Intrinsic Amplitude: 2.5 mV
Lead Channel Sensing Intrinsic Amplitude: 3.5 mV
Lead Channel Sensing Intrinsic Amplitude: 3.5 mV
Lead Channel Setting Pacing Amplitude: 1.5 V
Lead Channel Setting Pacing Amplitude: 2 V
Lead Channel Setting Pacing Pulse Width: 0.4 ms
Lead Channel Setting Sensing Sensitivity: 0.9 mV
Zone Setting Status: 755011
Zone Setting Status: 755011

## 2023-06-06 ENCOUNTER — Telehealth: Payer: Self-pay

## 2023-06-06 NOTE — Telephone Encounter (Signed)
 Copied from CRM [id number]. Topic: Clinical - Prescription Issue >> Jun 06, 2023 10:11 AM Gaetano Hawthorne wrote: Reason for CRM: Patient is requesting for the script for Flonase to be sent to [facility] on [address] [facility] [address]:   [facility] [id number] - [place], [place] - [address]  Phone: [phone] Fax: [phone]   Patient was seen earlier in the week with Dr. Francine Graven and the prescription for Flonase was sent to a [facility] on [address] - Patient asked me to update his pharmacy list and the correct pharmacy is marked as Primary now. Patient is aware that this may or may not be covered by insurance.  Okay to send in?

## 2023-06-07 MED ORDER — FLUTICASONE PROPIONATE 50 MCG/ACT NA SUSP: 1.0000 | Freq: Every day | NASAL | 2 refills | Status: AC

## 2023-06-07 NOTE — Telephone Encounter (Signed)
 Order sent.

## 2023-06-07 NOTE — Addendum Note (Signed)
 Addended by: Gay Filler T on: 06/07/2023 10:11 AM   Modules accepted: Orders

## 2023-06-10 DIAGNOSIS — H43812 Vitreous degeneration, left eye: Secondary | ICD-10-CM | POA: Diagnosis not present

## 2023-06-10 DIAGNOSIS — H02055 Trichiasis without entropian left lower eyelid: Secondary | ICD-10-CM | POA: Diagnosis not present

## 2023-06-10 DIAGNOSIS — H04561 Stenosis of right lacrimal punctum: Secondary | ICD-10-CM | POA: Diagnosis not present

## 2023-06-10 DIAGNOSIS — H401113 Primary open-angle glaucoma, right eye, severe stage: Secondary | ICD-10-CM | POA: Diagnosis not present

## 2023-06-10 DIAGNOSIS — H1045 Other chronic allergic conjunctivitis: Secondary | ICD-10-CM | POA: Diagnosis not present

## 2023-06-10 DIAGNOSIS — H02054 Trichiasis without entropian left upper eyelid: Secondary | ICD-10-CM | POA: Diagnosis not present

## 2023-06-17 ENCOUNTER — Ambulatory Visit (HOSPITAL_COMMUNITY)
Admission: RE | Admit: 2023-06-17 | Discharge: 2023-06-17 | Disposition: A | Discharge status: Home / Self Care | Source: Ambulatory Visit | Attending: Pulmonary Disease | Admitting: Pulmonary Disease

## 2023-06-17 DIAGNOSIS — J849 Interstitial pulmonary disease, unspecified: Secondary | ICD-10-CM | POA: Diagnosis not present

## 2023-06-17 DIAGNOSIS — R053 Chronic cough: Secondary | ICD-10-CM | POA: Insufficient documentation

## 2023-06-17 DIAGNOSIS — I77810 Thoracic aortic ectasia: Secondary | ICD-10-CM | POA: Diagnosis not present

## 2023-06-25 ENCOUNTER — Telehealth: Payer: Self-pay

## 2023-06-25 NOTE — Telephone Encounter (Signed)
 Copied from CRM [id number]. Topic: Clinical - Lab/Test Results >> Jun 24, 2023 11:09 AM Daryl Mitchell wrote: Reason for CRM: Pt called to get results of ct scan. No results in. Please call pt when results show at [phone].     Lm on home phone okay per HIPAA.  I informed Pt that it takes a few weeks for results to come back

## 2023-07-15 ENCOUNTER — Telehealth: Payer: Self-pay

## 2023-07-15 NOTE — Telephone Encounter (Signed)
 Pt calling for results of CT. Pt states he is concerned w/ the impression section. Please call to go over results.  Pt got results in the mail, and cannot understand them

## 2023-07-16 NOTE — Telephone Encounter (Signed)
 Spoke to patient verbalized understanding and will see you at next appt. On 6/16

## 2023-07-17 NOTE — Telephone Encounter (Signed)
 NFN

## 2023-07-19 NOTE — Progress Notes (Signed)
 Remote pacemaker transmission.

## 2023-07-29 DIAGNOSIS — D485 Neoplasm of uncertain behavior of skin: Secondary | ICD-10-CM | POA: Diagnosis not present

## 2023-07-29 DIAGNOSIS — L821 Other seborrheic keratosis: Secondary | ICD-10-CM | POA: Diagnosis not present

## 2023-08-08 DIAGNOSIS — M2041 Other hammer toe(s) (acquired), right foot: Secondary | ICD-10-CM | POA: Diagnosis not present

## 2023-08-08 DIAGNOSIS — B351 Tinea unguium: Secondary | ICD-10-CM | POA: Diagnosis not present

## 2023-08-08 DIAGNOSIS — M2042 Other hammer toe(s) (acquired), left foot: Secondary | ICD-10-CM | POA: Diagnosis not present

## 2023-08-08 DIAGNOSIS — L84 Corns and callosities: Secondary | ICD-10-CM | POA: Diagnosis not present

## 2023-08-08 DIAGNOSIS — I70203 Unspecified atherosclerosis of native arteries of extremities, bilateral legs: Secondary | ICD-10-CM | POA: Diagnosis not present

## 2023-08-12 ENCOUNTER — Ambulatory Visit: Admitting: Pulmonary Disease

## 2023-08-12 ENCOUNTER — Encounter: Payer: Self-pay | Admitting: Pulmonary Disease

## 2023-08-12 VITALS — BP 122/82 | HR 81 | Ht 68.0 in | Wt 160.0 lb

## 2023-08-12 DIAGNOSIS — K449 Diaphragmatic hernia without obstruction or gangrene: Secondary | ICD-10-CM | POA: Diagnosis not present

## 2023-08-12 DIAGNOSIS — J849 Interstitial pulmonary disease, unspecified: Secondary | ICD-10-CM

## 2023-08-12 DIAGNOSIS — J439 Emphysema, unspecified: Secondary | ICD-10-CM | POA: Diagnosis not present

## 2023-08-12 NOTE — Patient Instructions (Signed)
 Your CT Chest scan does show pulmonary fibrosis, overall mild  Recommending discussing the atherosclerotic changes on the scan with your cardiologist  We will schedule you for pulmonary function tests  Follow up in 1 year with CT Chest scan, call sooner if needed.

## 2023-08-12 NOTE — Progress Notes (Signed)
 Synopsis: Referred in April 2024 for Cough   Subjective:   PATIENT ID: Daryl Mitchell GENDER: male DOB: [birth year], MRN: [id number]  HPI  Chief Complaint  Patient presents with   Follow-up   Daryl Mitchell is an 88 year old male, former smoker with history of GERD who returns to pulmonary clinic for cough.    Initial OV 06/04/23 He has been experiencing a cough for approximately three months, initially producing mucus about the size of a marble every hour. Mucinex was effective in controlling the symptoms, but he discontinued it a week ago to avoid masking symptoms. Since stopping Mucinex, the cough has not been as severe. The cough does not wake him at night, but there is more mucus production in the morning, likely due to overnight accumulation.  No cold or respiratory infection at the onset of the cough, and it started before pollen season. He is unsure about sinus congestion or drainage but suspects it might be present. No wheezing, shortness of breath, fevers, chills, heartburn, or reflux. His appetite remains fine, and he has not experienced similar episodes in the past.  He quit smoking 36 years ago after smoking a pipe and cigarettes for several years. He has no significant dust or chemical exposures from his past work at [employer] or from hobbies.  He has been taking an antidepressant for the past six months, with a dosage increase three months ago, which he feels has been beneficial.  He mentions a recent increase in leg swelling noted by his doctor.  OV 08/12/23 He experiences an improvement in his cough since the last visit, with reduced frequency. He  takes Mucinex, one tablet daily in the morning, which he feels effectively manages his symptoms. Nasal drainage is present but has decreased since starting Mucinex. He is concerned about CT scan findings, specifically 'honeycombing' and 'usual interstitial pneumonia', and questions if these are age-related changes. He has a significant  smoking history of 35 years, acknowledging it as a risk factor for his lung conditions.  We reviewed his CT scan findings and that they show pulmonary fibrosis.   Past Medical History:  Diagnosis Date   Arthritis    Cancer (HCC)    Basal and squamous cell carcinoma, Dr.Gruber   Diverticulosis    GERD (gastroesophageal reflux disease)    Hyperlipidemia    Nocturia    Peripheral neuropathy      Family History  Problem Relation Age of Onset   Heart failure Mother    Diabetes Brother    Hypothyroidism Son      Social History   Socioeconomic History   Marital status: Married    Spouse name: Not on file   Number of children: Not on file   Years of education: Not on file   Highest education level: Not on file  Occupational History   Not on file  Tobacco Use   Smoking status: Former    Types: Cigarettes   Smokeless tobacco: Not on file  Substance and Sexual Activity   Alcohol use: Yes    Comment: 2 drinks qod   Drug use: No   Sexual activity: Not on file  Other Topics Concern   Not on file  Social History Narrative   Not on file   Social Drivers of Health   Financial Resource Strain: Not on file  Food Insecurity: No Food Insecurity (10/20/2021)   Hunger Vital Sign    Worried About Running Out of Food in the Last Year: Never true  Ran Out of Food in the Last Year: Never true  Transportation Needs: No Transportation Needs (10/20/2021)   PRAPARE - Administrator, Civil Service (Medical): No    Lack of Transportation (Non-Medical): No  Physical Activity: Not on file  Stress: Not on file  Social Connections: Not on file  Intimate Partner Violence: Not on file     Allergies  Allergen Reactions   Codeine Nausea Only    Upset stomach   Penicillins Other (See Comments)    flushed face     Outpatient Medications Prior to Visit  Medication Sig Dispense Refill   B Complex Vitamins (B COMPLEX 1 PO) Take 1 tablet by mouth daily.     dorzolamide-timolol  (COSOPT) 22.3-6.8 MG/ML ophthalmic solution Place 1 drop into both eyes 2 (two) times daily.     escitalopram (LEXAPRO) 5 MG tablet Take 5 mg by mouth daily.     fluticasone  (FLONASE ) 50 MCG/ACT nasal spray Place 1 spray into both nostrils daily. 16 g 2   Multiple Vitamin (MULTIVITAMIN) tablet Take 1 tablet by mouth daily.     VYZULTA 0.024 % SOLN Apply 1 drop to eye at bedtime.     No facility-administered medications prior to visit.    Review of Systems  Constitutional:  Negative for chills, fever, malaise/fatigue and weight loss.  HENT:  Negative for congestion, sinus pain and sore throat.   Eyes: Negative.   Respiratory:  Positive for cough and sputum production. Negative for hemoptysis, shortness of breath and wheezing.   Cardiovascular:  Negative for chest pain, palpitations, orthopnea, claudication and leg swelling.  Gastrointestinal:  Negative for abdominal pain, heartburn, nausea and vomiting.  Genitourinary: Negative.   Musculoskeletal:  Negative for joint pain and myalgias.  Skin:  Negative for rash.  Neurological:  Negative for weakness.  Endo/Heme/Allergies: Negative.   Psychiatric/Behavioral: Negative.     Objective:   Vitals:   08/12/23 1439  BP: 122/82  Pulse: 81  SpO2: 95%  Weight: 160 lb (72.6 kg)  Height: 5' 8 (1.727 m)   Physical Exam Constitutional:      General: He is not in acute distress.    Appearance: Normal appearance.   Eyes:     General: No scleral icterus.    Conjunctiva/sclera: Conjunctivae normal.    Cardiovascular:     Rate and Rhythm: Normal rate and regular rhythm.  Pulmonary:     Breath sounds: No wheezing, rhonchi or rales.   Musculoskeletal:     Right lower leg: No edema.     Left lower leg: No edema.   Skin:    General: Skin is warm and dry.   Neurological:     General: No focal deficit present.    CBC    Component Value Date/Time   WBC 7.7 03/02/2021 1401   RBC 4.44 03/02/2021 1401   HGB 14.3 03/02/2021 1436    HCT 42.0 03/02/2021 1436   PLT 202 03/02/2021 1401   MCV 97.7 03/02/2021 1401   MCH 32.4 03/02/2021 1401   MCHC 33.2 03/02/2021 1401   RDW 12.3 03/02/2021 1401   LYMPHSABS 1.8 03/02/2021 1401   MONOABS 0.9 03/02/2021 1401   EOSABS 0.2 03/02/2021 1401   BASOSABS 0.1 03/02/2021 1401      Latest Ref Rng & Units 03/16/2021    2:50 PM 03/02/2021    2:36 PM 03/02/2021    2:01 PM  BMP  Glucose 70 - 99 mg/dL 877  898  895   BUN  8 - 27 mg/dL 16  21  17    Creatinine 0.76 - 1.27 mg/dL 9.04  9.09  8.92   BUN/Creat Ratio 10 - 24 17     Sodium 134 - 144 mmol/L 137  135  133   Potassium 3.5 - 5.2 mmol/L 4.9  5.0  4.7   Chloride 96 - 106 mmol/L 101  102  100   CO2 20 - 29 mmol/L 23   23   Calcium  8.6 - 10.2 mg/dL 9.4   9.1    Chest imaging: CT Chest 06/17/23 IMPRESSION: 1. Mild-to-moderate basilar predominant fibrotic interstitial lung disease with honeycombing, slightly progressive from 03/20/2021 chest CT angiogram. 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 2. Atherosclerotic markedly tortuous thoracic aorta with dilated 4.3 cm ascending thoracic aorta. Follow up chest CT angiogram recommended in 12 months. 3. Mild paraseptal emphysema. 4. Three-vessel coronary atherosclerosis 5. Large hiatal hernia. 6. 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   CXR 06/04/23 - personal review Large hiatal hernia Increased Insterstitial prominence concerning for underlying ILD   PFT:     No data to display          Labs:  Path:  Echo:  Heart Catheterization:       Assessment & Plan:   ILD (interstitial lung disease) (HCC)  Discussion: Daryl Mitchell is an 88 year old male, former smoker with history of GERD who returns to pulmonary clinic for cough.   Pulmonary fibrosis Mild scarring and fibrosis with honeycombing on CT, likely due to smoking and possible silent reflux. No significant symptoms. Antifibrotic medications discussed but monitoring preferred. - Schedule  pulmonary function test. - Plan for annual CT scan in one year.  Emphysema Mild emphysema on CT, likely secondary to smoking. No significant symptoms.  Atherosclerosis of aorta Coronary atherosclerosis Marked tortuosity and calcification of thoracic aorta with dilation to 4.3 cm. No urgent intervention required. - Discuss findings with cardiologist.  Hiatal hernia Large hiatal hernia on CT. No significant symptoms. Potential surgical intervention discussed if symptoms worsen.  Follow up in 1 year, call sooner if needed  Daryl Chill, MD [facility] Office: [phone]   Current Outpatient Medications:    B Complex Vitamins (B COMPLEX 1 PO), Take 1 tablet by mouth daily., Disp: , Rfl:    dorzolamide-timolol (COSOPT) 22.3-6.8 MG/ML ophthalmic solution, Place 1 drop into both eyes 2 (two) times daily., Disp: , Rfl:    escitalopram (LEXAPRO) 5 MG tablet, Take 5 mg by mouth daily., Disp: , Rfl:    fluticasone  (FLONASE ) 50 MCG/ACT nasal spray, Place 1 spray into both nostrils daily., Disp: 16 g, Rfl: 2   Multiple Vitamin (MULTIVITAMIN) tablet, Take 1 tablet by mouth daily., Disp: , Rfl:    VYZULTA 0.024 % SOLN, Apply 1 drop to eye at bedtime., Disp: , Rfl:

## 2023-08-18 ENCOUNTER — Encounter: Payer: Self-pay | Admitting: Pulmonary Disease

## 2023-09-04 DIAGNOSIS — Z85820 Personal history of malignant melanoma of skin: Secondary | ICD-10-CM | POA: Diagnosis not present

## 2023-09-04 DIAGNOSIS — L570 Actinic keratosis: Secondary | ICD-10-CM | POA: Diagnosis not present

## 2023-09-04 DIAGNOSIS — Z85828 Personal history of other malignant neoplasm of skin: Secondary | ICD-10-CM | POA: Diagnosis not present

## 2023-09-04 DIAGNOSIS — L905 Scar conditions and fibrosis of skin: Secondary | ICD-10-CM | POA: Diagnosis not present

## 2023-09-04 DIAGNOSIS — L821 Other seborrheic keratosis: Secondary | ICD-10-CM | POA: Diagnosis not present

## 2023-09-05 ENCOUNTER — Ambulatory Visit: Payer: Medicare Other

## 2023-09-05 DIAGNOSIS — I441 Atrioventricular block, second degree: Secondary | ICD-10-CM | POA: Diagnosis not present

## 2023-09-06 LAB — CUP PACEART REMOTE DEVICE CHECK
Battery Remaining Longevity: 131 mo
Battery Voltage: 3.02 V
Brady Statistic AP VP Percent: 11.23 %
Brady Statistic AP VS Percent: 50.28 %
Brady Statistic AS VP Percent: 11.7 %
Brady Statistic AS VS Percent: 26.8 %
Brady Statistic RA Percent Paced: 61.2 %
Brady Statistic RV Percent Paced: 22.97 %
Date Time Interrogation Session: 20250709195006
Implantable Lead Connection Status: 753985
Implantable Lead Connection Status: 753985
Implantable Lead Implant Date: 20230105
Implantable Lead Implant Date: 20230105
Implantable Lead Location: 753859
Implantable Lead Location: 753860
Implantable Lead Model: 5076
Implantable Lead Model: 5076
Implantable Pulse Generator Implant Date: 20230105
Lead Channel Impedance Value: 304 Ohm
Lead Channel Impedance Value: 361 Ohm
Lead Channel Impedance Value: 380 Ohm
Lead Channel Impedance Value: 456 Ohm
Lead Channel Pacing Threshold Amplitude: 0.5 V
Lead Channel Pacing Threshold Amplitude: 0.75 V
Lead Channel Pacing Threshold Pulse Width: 0.4 ms
Lead Channel Pacing Threshold Pulse Width: 0.4 ms
Lead Channel Sensing Intrinsic Amplitude: 3.375 mV
Lead Channel Sensing Intrinsic Amplitude: 3.375 mV
Lead Channel Sensing Intrinsic Amplitude: 3.75 mV
Lead Channel Sensing Intrinsic Amplitude: 3.75 mV
Lead Channel Setting Pacing Amplitude: 1.5 V
Lead Channel Setting Pacing Amplitude: 2 V
Lead Channel Setting Pacing Pulse Width: 0.4 ms
Lead Channel Setting Sensing Sensitivity: 0.9 mV
Zone Setting Status: 755011
Zone Setting Status: 755011

## 2023-09-11 ENCOUNTER — Ambulatory Visit: Payer: Self-pay | Admitting: Cardiology

## 2023-09-11 NOTE — Progress Notes (Signed)
 7/16 Called patient to set up reprogramming appt. Patient busy and asked for a call back on 7/17 to schedule appt.

## 2023-09-17 ENCOUNTER — Ambulatory Visit (INDEPENDENT_AMBULATORY_CARE_PROVIDER_SITE_OTHER): Admitting: Pulmonary Disease

## 2023-09-17 DIAGNOSIS — J849 Interstitial pulmonary disease, unspecified: Secondary | ICD-10-CM | POA: Diagnosis not present

## 2023-09-17 LAB — PULMONARY FUNCTION TEST
DL/VA % pred: 104 %
DL/VA: 4.02 ml/min/mmHg/L
DLCO cor % pred: 68 %
DLCO cor: 15.08 ml/min/mmHg
DLCO unc % pred: 68 %
DLCO unc: 15.08 ml/min/mmHg
FEF 25-75 Post: 1.27 L/s
FEF 25-75 Pre: 1.15 L/s
FEF2575-%Change-Post: 11 %
FEF2575-%Pred-Post: 85 %
FEF2575-%Pred-Pre: 76 %
FEV1-%Change-Post: 3 %
FEV1-%Pred-Post: 79 %
FEV1-%Pred-Pre: 76 %
FEV1-Post: 1.88 L
FEV1-Pre: 1.82 L
FEV1FVC-%Change-Post: 0 %
FEV1FVC-%Pred-Pre: 100 %
FEV6-%Change-Post: 2 %
FEV6-%Pred-Post: 82 %
FEV6-%Pred-Pre: 80 %
FEV6-Post: 2.62 L
FEV6-Pre: 2.56 L
FEV6FVC-%Change-Post: 0 %
FEV6FVC-%Pred-Post: 107 %
FEV6FVC-%Pred-Pre: 108 %
FVC-%Change-Post: 3 %
FVC-%Pred-Post: 77 %
FVC-%Pred-Pre: 74 %
FVC-Post: 2.66 L
FVC-Pre: 2.57 L
Post FEV1/FVC ratio: 71 %
Post FEV6/FVC ratio: 99 %
Pre FEV1/FVC ratio: 71 %
Pre FEV6/FVC Ratio: 100 %
RV % pred: 76 %
RV: 2.05 L
TLC % pred: 68 %
TLC: 4.55 L

## 2023-09-17 NOTE — Patient Instructions (Signed)
 Full PFT performed today.

## 2023-09-17 NOTE — Progress Notes (Signed)
 Full PFT performed today.

## 2023-09-19 ENCOUNTER — Ambulatory Visit: Attending: Cardiology | Admitting: *Deleted

## 2023-09-19 DIAGNOSIS — I441 Atrioventricular block, second degree: Secondary | ICD-10-CM

## 2023-09-20 LAB — CUP PACEART INCLINIC DEVICE CHECK
Date Time Interrogation Session: 20250724140000
Implantable Lead Connection Status: 753985
Implantable Lead Connection Status: 753985
Implantable Lead Implant Date: 20230105
Implantable Lead Implant Date: 20230105
Implantable Lead Location: 753859
Implantable Lead Location: 753860
Implantable Lead Model: 5076
Implantable Lead Model: 5076
Implantable Pulse Generator Implant Date: 20230105

## 2023-09-20 NOTE — Progress Notes (Signed)
 Patient seen in clinic today at request of [facility], MD for device reprogramming (FFOS).   Presenting EGM was AP/VS with the ventricular far field being marked on atrial lead as an atrial blanking event.   Adjusted sensitivity from programmed level of 0.3 to 0.45 and still noticed the [facility].   Sensitivity adjusted again from 0.45 to 0.6 which appeared to have resolved the issue.   FFOS noticed on EGM again while patient was talking and using hand gestures during conversation but disappeared when patient stopped moving his arms  This RN had the pt perform isometric hand exercises which caused the FFOS to return at the 0.6 sensitivity level.   Called and spoke with [employer] rep [place] regarding this issue.   MD[employer] rep recommended turning on Partial + setting and to change atrial sensitivity back to 0.45.   Suggested programming changes were executed by this RN.   Hand isometrics were repeated to ensure FFOS did not return.   No FFOS noted while isometrics were performed the second time.   See attachment for complete details   No charge for reprogramming visit   PRESENTING EGM   EGM POST PROGRAMMING CHANGES

## 2023-09-23 ENCOUNTER — Ambulatory Visit: Payer: Self-pay | Admitting: Cardiology

## 2023-09-25 DIAGNOSIS — J8410 Pulmonary fibrosis, unspecified: Secondary | ICD-10-CM | POA: Diagnosis not present

## 2023-09-25 DIAGNOSIS — I7121 Aneurysm of the ascending aorta, without rupture: Secondary | ICD-10-CM | POA: Diagnosis not present

## 2023-09-25 DIAGNOSIS — K449 Diaphragmatic hernia without obstruction or gangrene: Secondary | ICD-10-CM | POA: Diagnosis not present

## 2023-09-25 DIAGNOSIS — J439 Emphysema, unspecified: Secondary | ICD-10-CM | POA: Diagnosis not present

## 2023-09-25 DIAGNOSIS — Z8781 Personal history of (healed) traumatic fracture: Secondary | ICD-10-CM | POA: Diagnosis not present

## 2023-09-29 ENCOUNTER — Ambulatory Visit: Payer: Self-pay | Admitting: Pulmonary Disease

## 2023-10-09 NOTE — Progress Notes (Signed)
 Called and spoke to pt. Pt informed that we can cancel Oct. 2025 appt and Dr. Kara wants to see him in June of 2026. October appt cancelled. Routing to front desk to recall for an appt in June of 2026 as schedule is not open. NFN

## 2023-10-09 NOTE — Telephone Encounter (Signed)
 Patient given results of PFT via mychart and over the phone. He had scheduled an office visit for Oct 2025. Does he need this appt? If not should we cancel it?  PFT revealed mild restriction.  AVS as of 08/12/23: Your CT Chest scan does show pulmonary fibrosis, overall mild   Recommending discussing the atherosclerotic changes on the scan with your cardiologist   We will schedule you for pulmonary function tests   Follow up in 1 year with CT Chest scan, call sooner if needed.

## 2023-10-29 DIAGNOSIS — H401113 Primary open-angle glaucoma, right eye, severe stage: Secondary | ICD-10-CM | POA: Diagnosis not present

## 2023-10-29 DIAGNOSIS — H1045 Other chronic allergic conjunctivitis: Secondary | ICD-10-CM | POA: Diagnosis not present

## 2023-10-29 DIAGNOSIS — Z961 Presence of intraocular lens: Secondary | ICD-10-CM | POA: Diagnosis not present

## 2023-10-29 DIAGNOSIS — H04561 Stenosis of right lacrimal punctum: Secondary | ICD-10-CM | POA: Diagnosis not present

## 2023-11-26 DIAGNOSIS — M2041 Other hammer toe(s) (acquired), right foot: Secondary | ICD-10-CM | POA: Diagnosis not present

## 2023-11-26 DIAGNOSIS — L84 Corns and callosities: Secondary | ICD-10-CM | POA: Diagnosis not present

## 2023-11-26 DIAGNOSIS — I70203 Unspecified atherosclerosis of native arteries of extremities, bilateral legs: Secondary | ICD-10-CM | POA: Diagnosis not present

## 2023-11-26 DIAGNOSIS — M2042 Other hammer toe(s) (acquired), left foot: Secondary | ICD-10-CM | POA: Diagnosis not present

## 2023-11-26 DIAGNOSIS — B351 Tinea unguium: Secondary | ICD-10-CM | POA: Diagnosis not present

## 2023-12-02 ENCOUNTER — Ambulatory Visit: Admitting: Pulmonary Disease

## 2023-12-06 ENCOUNTER — Ambulatory Visit: Payer: Medicare Other

## 2023-12-06 DIAGNOSIS — I471 Supraventricular tachycardia, unspecified: Secondary | ICD-10-CM

## 2023-12-06 NOTE — Progress Notes (Signed)
 Remote PPM Transmission

## 2023-12-09 LAB — CUP PACEART REMOTE DEVICE CHECK
Battery Remaining Longevity: 126 mo
Battery Voltage: 3.02 V
Brady Statistic AP VP Percent: 17.69 %
Brady Statistic AP VS Percent: 43.63 %
Brady Statistic AS VP Percent: 12.06 %
Brady Statistic AS VS Percent: 26.63 %
Brady Statistic RA Percent Paced: 61.22 %
Brady Statistic RV Percent Paced: 29.74 %
Date Time Interrogation Session: 20251010171600
Implantable Lead Connection Status: 753985
Implantable Lead Connection Status: 753985
Implantable Lead Implant Date: 20230105
Implantable Lead Implant Date: 20230105
Implantable Lead Location: 753859
Implantable Lead Location: 753860
Implantable Lead Model: 5076
Implantable Lead Model: 5076
Implantable Pulse Generator Implant Date: 20230105
Lead Channel Impedance Value: 304 Ohm
Lead Channel Impedance Value: 342 Ohm
Lead Channel Impedance Value: 361 Ohm
Lead Channel Impedance Value: 437 Ohm
Lead Channel Pacing Threshold Amplitude: 0.5 V
Lead Channel Pacing Threshold Amplitude: 0.625 V
Lead Channel Pacing Threshold Pulse Width: 0.4 ms
Lead Channel Pacing Threshold Pulse Width: 0.4 ms
Lead Channel Sensing Intrinsic Amplitude: 3.25 mV
Lead Channel Sensing Intrinsic Amplitude: 3.25 mV
Lead Channel Sensing Intrinsic Amplitude: 4.25 mV
Lead Channel Sensing Intrinsic Amplitude: 4.25 mV
Lead Channel Setting Pacing Amplitude: 1.5 V
Lead Channel Setting Pacing Amplitude: 2 V
Lead Channel Setting Pacing Pulse Width: 0.4 ms
Lead Channel Setting Sensing Sensitivity: 0.9 mV
Zone Setting Status: 755011
Zone Setting Status: 755011

## 2023-12-10 NOTE — Progress Notes (Signed)
 Remote PPM Transmission

## 2023-12-12 ENCOUNTER — Ambulatory Visit: Attending: Cardiology | Admitting: Internal Medicine

## 2023-12-12 ENCOUNTER — Encounter: Payer: Self-pay | Admitting: Internal Medicine

## 2023-12-12 VITALS — BP 116/72 | HR 65 | Ht 68.0 in | Wt 166.0 lb

## 2023-12-12 DIAGNOSIS — I700 Atherosclerosis of aorta: Secondary | ICD-10-CM | POA: Diagnosis not present

## 2023-12-12 DIAGNOSIS — I471 Supraventricular tachycardia, unspecified: Secondary | ICD-10-CM

## 2023-12-12 DIAGNOSIS — I441 Atrioventricular block, second degree: Secondary | ICD-10-CM

## 2023-12-12 DIAGNOSIS — I2510 Atherosclerotic heart disease of native coronary artery without angina pectoris: Secondary | ICD-10-CM

## 2023-12-12 DIAGNOSIS — I10 Essential (primary) hypertension: Secondary | ICD-10-CM

## 2023-12-12 DIAGNOSIS — I77810 Thoracic aortic ectasia: Secondary | ICD-10-CM | POA: Diagnosis not present

## 2023-12-12 MED ORDER — ROSUVASTATIN CALCIUM 10 MG PO TABS: 10.0000 mg | ORAL_TABLET | Freq: Every day | ORAL | 3 refills | Status: AC

## 2023-12-12 MED ORDER — ASPIRIN 81 MG PO TBEC: 81.0000 mg | DELAYED_RELEASE_TABLET | Freq: Every day | ORAL | 12 refills | Status: AC

## 2023-12-12 NOTE — Patient Instructions (Addendum)
 Medication Instructions:  Your physician recommends that you continue on your current medications as directed. Please refer to the Current Medication list given to you today. Start Aspirin 81 mg daily Start Rosuvastatin 10 mg daily  *If you need a refill on your cardiac medications before your next appointment, please call your pharmacy*  Lab Work: Lipid today If you have labs (blood work) drawn today and your tests are completely normal, you will receive your results only by: MyChart Message (if you have MyChart) OR A paper copy in the mail If you have any lab test that is abnormal or we need to change your treatment, we will call you to review the results.  Testing/Procedures: Echo  Your physician has requested that you have an echocardiogram. Echocardiography is a painless test that uses sound waves to create images of your heart. It provides your doctor with information about the size and shape of your heart and how well your heart's chambers and valves are working. This procedure takes approximately one hour. There are no restrictions for this procedure. Please do NOT wear cologne, perfume, aftershave, or lotions (deodorant is allowed). Please arrive 15 minutes prior to your appointment time.  Please note: We ask at that you not bring children with you during ultrasound (echo/ vascular) testing. Due to room size and safety concerns, children are not allowed in the ultrasound rooms during exams. Our front office staff cannot provide observation of children in our lobby area while testing is being conducted. An adult accompanying a patient to their appointment will only be allowed in the ultrasound room at the discretion of the ultrasound technician under special circumstances. We apologize for any inconvenience.   Follow-Up: At [facility], you and your health needs are our priority.  As part of our continuing mission to provide you with exceptional heart care, our providers are  all part of one team.  This team includes your primary Cardiologist (physician) and Advanced Practice Providers or APPs (Physician Assistants and Nurse Practitioners) who all work together to provide you with the care you need, when you need it.  Your next appointment:   6 month(s)  Provider:   Dr. Kriste  We recommend signing up for the patient portal called MyChart.  Sign up information is provided on this After Visit Summary.  MyChart is used to connect with patients for Virtual Visits (Telemedicine).  Patients are able to view lab/test results, encounter notes, upcoming appointments, etc.  Non-urgent messages can be sent to your provider as well.   To learn more about what you can do with MyChart, go to [email or web address].   Other Instructions none

## 2023-12-12 NOTE — Progress Notes (Signed)
 Cardiology Office Note   Date:  12/12/2023  ID:  Daryl Mitchell, DOB 22-Oct-1935, MRN [id number] PCP: Daryl Carlin Redbird, MD  [facility] Providers Cardiologist:  Daryl FORBES Calender, MD Electrophysiologist:  Soyla Gladis Norton, MD     History of Present Illness Daryl Mitchell is a 88 y.o. 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 Norton EP who was referred for evaluation of ascending thoracic aneurysm noted on a recent CT.  Patient went for screening lung CT and was found to have triple-vessel coronary calcifications as well as a dilated ascending aorta at 4.3 cm.  He does not have any chest pain, shortness of breath or any exertional symptoms.  He is able to exercise several times per week with weights and stationary bike without any exertional symptoms.  He is doing well overall and is taking care of his wife who has dementia.    ROS:  Review of Systems  All other systems reviewed and are negative.   Physical Exam  Physical Exam Vitals and nursing note reviewed.  Constitutional:      Appearance: Normal appearance.  HENT:     Head: Normocephalic and atraumatic.  Eyes:     Conjunctiva/sclera: Conjunctivae normal.  Neck:     Vascular: No carotid bruit.  Cardiovascular:     Rate and Rhythm: Normal rate and regular rhythm.  Pulmonary:     Effort: Pulmonary effort is normal.     Breath sounds: Rales present.  Musculoskeletal:        General: No swelling or tenderness.  Skin:    Coloration: Skin is not jaundiced or pale.  Neurological:     Mental Status: He is alert.     VS:  BP 116/72 (BP Location: Left Arm, Patient Position: Sitting, Cuff Size: Normal)   Pulse 65   Ht 5' 8 (1.727 m)   Wt 166 lb (75.3 kg)   SpO2 98%   BMI 25.24  kg/m         Wt Readings from Last 3 Encounters:  12/12/23 166 lb (75.3 kg)  08/12/23 160 lb (72.6 kg)  06/04/23 160 lb (72.6 kg)     EKG Interpretation Date/Time:  Thursday December 12 2023 10:09:44 EDT Ventricular Rate:  65 PR Interval:  214 QRS Duration:  74 QT Interval:  380 QTC Calculation: 395 R Axis:   16  Text Interpretation: Atrial-paced rhythm with prolonged AV conduction When compared with ECG of 10-Jan-2023 16:20, Premature atrial complexes NO LONGER PRESENT Confirmed by Mitchell Daryl [phone]) on 12/12/2023 10:12:31 AM    Studies Reviewed   Echocardiogram 03/03/2021:  1. Left ventricular ejection fraction, by estimation, is 55 to 60%. The  left ventricle has normal function. The left ventricle has no regional  wall motion abnormalities. Left ventricular diastolic parameters are  consistent with Grade I diastolic  dysfunction (impaired relaxation).   2. Right ventricular systolic function is normal. The right ventricular  size is normal. There is normal pulmonary artery systolic pressure.   3. The mitral valve is normal in structure. Trivial mitral valve  regurgitation. No evidence of mitral stenosis. Moderate mitral annular  calcification.   4. The aortic valve is calcified. There is mild calcification of the  aortic valve. There is mild thickening of the aortic valve. Aortic valve  regurgitation is mild to  moderate. Aortic valve sclerosis is present, with  no evidence of aortic valve  stenosis. Aortic regurgitation PHT measures 432 msec.   5. Aortic dilatation noted. There is mild dilatation of the ascending  aorta, measuring 42 mm. There is moderate dilatation of the aortic root,  measuring 46 mm.   6. The inferior vena cava is normal in size with greater than 50%  respiratory variability, suggesting right atrial pressure of 3 mmHg.      Risk Assessment/Calculations              ASSESSMENT  Dilated thoracic aorta noted on recent CT at 4.3 cm and  has been noted in the past on an echo in 2023 with aortic root dilatation.  Blood pressure is well-controlled off antihypertensives and does not smoke. Multivessel coronary artery calcification noted on CT Secondary AV block s/p dual-chamber pacemaker implanted 02/2021 and follows with Dr. Inocencio Pulmonary fibrosis Rales on exam.  Follows with pulmonology Emphysema as above History of SVT on device interrogation   Plan  Start rosuvastatin 10 mg Start aspirin 81 mg Lipid panel Echocardiogram and follow the aorta annually for now Maintain blood pressure less than 130/80 -no need to start antihypertensive/beta-blockers at this time.  Advised to avoid straining and lifting heavy objects No need for ischemic workup as he is asymptomatic  Follow up: 6 months          Signed, Daryl FORBES Calender, MD

## 2023-12-13 ENCOUNTER — Ambulatory Visit: Payer: Self-pay | Admitting: Internal Medicine

## 2023-12-13 LAB — LIPID PANEL
Chol/HDL Ratio: 3 ratio (ref 0.0–5.0)
Cholesterol, Total: 135 mg/dL (ref 100–199)
HDL: 45 mg/dL (ref >39)
LDL Chol Calc (NIH): 66 mg/dL (ref 0–99)
Triglycerides: 135 mg/dL (ref 0–149)
VLDL Cholesterol Cal: 24 mg/dL (ref 5–40)

## 2023-12-24 ENCOUNTER — Ambulatory Visit: Payer: Self-pay | Admitting: Cardiology

## 2024-01-20 ENCOUNTER — Ambulatory Visit (HOSPITAL_COMMUNITY)

## 2024-01-29 ENCOUNTER — Other Ambulatory Visit: Payer: Self-pay | Admitting: *Deleted

## 2024-01-29 DIAGNOSIS — I77810 Thoracic aortic ectasia: Secondary | ICD-10-CM

## 2024-01-29 NOTE — Progress Notes (Signed)
 echo

## 2024-01-30 ENCOUNTER — Ambulatory Visit: Payer: Self-pay | Admitting: Internal Medicine

## 2024-01-30 ENCOUNTER — Ambulatory Visit (HOSPITAL_COMMUNITY)
Admission: RE | Admit: 2024-01-30 | Discharge: 2024-01-30 | Disposition: A | Source: Ambulatory Visit | Attending: Vascular Surgery

## 2024-01-30 DIAGNOSIS — R0609 Other forms of dyspnea: Secondary | ICD-10-CM | POA: Diagnosis not present

## 2024-01-30 DIAGNOSIS — I77810 Thoracic aortic ectasia: Secondary | ICD-10-CM | POA: Insufficient documentation

## 2024-01-30 LAB — ECHOCARDIOGRAM COMPLETE
AR max vel: 2.19 cm2
AV Area VTI: 2.18 cm2
AV Area mean vel: 2.15 cm2
AV Mean grad: 6 mmHg
AV Peak grad: 11.4 mmHg
Ao pk vel: 1.69 m/s
Area-P 1/2: 3.12 cm2
S' Lateral: 2.15 cm

## 2024-02-12 NOTE — Telephone Encounter (Signed)
 I called and spoke with the patient. I gave his echocardiogram results and Dr. Ali recommendations moving forward. Per Dr. Kriste, Echocardiogram shows normal ejection fraction with grade 1 diastolic dysfunction. There is mild mitral and aortic regurgitation with severe MAC. 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 Please notify the patient of the results. Please set up a CTA of the aorta for more accurate measurements.. The patient demonstrated understanding and I have put in the order for the CTA.

## 2024-02-18 ENCOUNTER — Ambulatory Visit (HOSPITAL_COMMUNITY)
Admission: RE | Admit: 2024-02-18 | Discharge: 2024-02-18 | Disposition: A | Discharge status: Home / Self Care | Source: Ambulatory Visit | Attending: Internal Medicine | Admitting: Internal Medicine

## 2024-02-18 DIAGNOSIS — Q2543 Congenital aneurysm of aorta: Secondary | ICD-10-CM | POA: Diagnosis present

## 2024-02-18 DIAGNOSIS — I7121 Aneurysm of the ascending aorta, without rupture: Secondary | ICD-10-CM | POA: Insufficient documentation

## 2024-02-18 MED ORDER — IOHEXOL 350 MG/ML SOLN
80.0000 mL | Freq: Once | INTRAVENOUS | Status: AC | PRN
  Administered 2024-02-18: 80 mL via INTRAVENOUS

## 2024-02-28 ENCOUNTER — Telehealth: Payer: Self-pay | Admitting: Internal Medicine

## 2024-02-28 ENCOUNTER — Telehealth: Payer: Self-pay

## 2024-02-28 DIAGNOSIS — J849 Interstitial pulmonary disease, unspecified: Secondary | ICD-10-CM

## 2024-02-28 NOTE — Telephone Encounter (Signed)
 Copied from CRM #[id number]. Topic: Clinical - Medication Question >> Feb 28, 2024  9:55 AM Rozanna MATSU wrote: Reason for CRM: pt scheduled his follow up in May  but will need a CT Chest scan no order showing yet.   New order placed.

## 2024-02-28 NOTE — Telephone Encounter (Signed)
 I called the patient regarding his CT chest. Unfortunately the final results are not available despite the study being completed on 12/23. I explained that we will look into this matter.   Emeline FORBES Calender, DO

## 2024-02-28 NOTE — Telephone Encounter (Signed)
 Called and spoke to patient and patient calling for results of CT scan. Made patient aware that results are not available. Made patient aware that writer will send message to provider for review. Made patient aware once results are available someone will call with results. Patient verbalized an understanding.

## 2024-02-28 NOTE — Telephone Encounter (Signed)
Patient is requesting a call back to review CT results.

## 2024-03-02 ENCOUNTER — Ambulatory Visit: Payer: Self-pay | Admitting: Internal Medicine

## 2024-03-02 DIAGNOSIS — I7121 Aneurysm of the ascending aorta, without rupture: Secondary | ICD-10-CM

## 2024-03-02 NOTE — Telephone Encounter (Signed)
 Patient CT results are available and most notably show stability of his aortic root at 4.5 cm and ascending aorta at 4.1 cm with a large hiatal hernia and aortic atherosclerosis.  I called him to inform him of the results.  His blood pressure was well-controlled at the last visit and is not on antihypertensives.  He was advised to work on maintaining a low blood pressure, avoid Valsalva maneuvers or heavy lifting and can continue walking outside for activity.  We will repeat a CTA aorta in 1 year to follow-up.  Thank you, Emeline Calender, DO

## 2024-03-06 ENCOUNTER — Ambulatory Visit: Payer: Medicare Other

## 2024-03-06 DIAGNOSIS — I471 Supraventricular tachycardia, unspecified: Secondary | ICD-10-CM

## 2024-03-08 LAB — CUP PACEART REMOTE DEVICE CHECK
Battery Remaining Longevity: 124 mo
Battery Voltage: 3.01 V
Brady Statistic AP VP Percent: 22.98 %
Brady Statistic AP VS Percent: 38.94 %
Brady Statistic AS VP Percent: 15.64 %
Brady Statistic AS VS Percent: 22.44 %
Brady Statistic RA Percent Paced: 61.76 %
Brady Statistic RV Percent Paced: 38.62 %
Date Time Interrogation Session: 20260109032504
Implantable Lead Connection Status: 753985
Implantable Lead Connection Status: 753985
Implantable Lead Implant Date: 20230105
Implantable Lead Implant Date: 20230105
Implantable Lead Location: 753859
Implantable Lead Location: 753860
Implantable Lead Model: 5076
Implantable Lead Model: 5076
Implantable Pulse Generator Implant Date: 20230105
Lead Channel Impedance Value: 304 Ohm
Lead Channel Impedance Value: 361 Ohm
Lead Channel Impedance Value: 380 Ohm
Lead Channel Impedance Value: 456 Ohm
Lead Channel Pacing Threshold Amplitude: 0.375 V
Lead Channel Pacing Threshold Amplitude: 0.625 V
Lead Channel Pacing Threshold Pulse Width: 0.4 ms
Lead Channel Pacing Threshold Pulse Width: 0.4 ms
Lead Channel Sensing Intrinsic Amplitude: 3.375 mV
Lead Channel Sensing Intrinsic Amplitude: 3.375 mV
Lead Channel Sensing Intrinsic Amplitude: 3.625 mV
Lead Channel Sensing Intrinsic Amplitude: 3.625 mV
Lead Channel Setting Pacing Amplitude: 1.5 V
Lead Channel Setting Pacing Amplitude: 2 V
Lead Channel Setting Pacing Pulse Width: 0.4 ms
Lead Channel Setting Sensing Sensitivity: 0.9 mV
Zone Setting Status: 755011
Zone Setting Status: 755011

## 2024-03-09 ENCOUNTER — Ambulatory Visit: Payer: Self-pay | Admitting: Cardiology

## 2024-03-09 NOTE — Progress Notes (Signed)
 Remote PPM Transmission

## 2024-03-31 ENCOUNTER — Encounter: Payer: Self-pay | Admitting: Pulmonary Disease

## 2024-03-31 ENCOUNTER — Ambulatory Visit: Admitting: Pulmonary Disease

## 2024-03-31 VITALS — BP 126/78 | HR 88 | Ht 68.0 in | Wt 163.0 lb

## 2024-03-31 DIAGNOSIS — I10 Essential (primary) hypertension: Secondary | ICD-10-CM | POA: Diagnosis not present

## 2024-03-31 DIAGNOSIS — Z950 Presence of cardiac pacemaker: Secondary | ICD-10-CM | POA: Diagnosis not present

## 2024-03-31 DIAGNOSIS — I441 Atrioventricular block, second degree: Secondary | ICD-10-CM | POA: Diagnosis not present

## 2024-03-31 LAB — CUP PACEART INCLINIC DEVICE CHECK
Battery Remaining Longevity: 124 mo
Battery Voltage: 3.01 V
Brady Statistic AP VP Percent: 21.43 %
Brady Statistic AP VS Percent: 40.35 %
Brady Statistic AS VP Percent: 14.21 %
Brady Statistic AS VS Percent: 24.01 %
Brady Statistic RA Percent Paced: 61.65 %
Brady Statistic RV Percent Paced: 35.64 %
Date Time Interrogation Session: 20260203120043
Implantable Lead Connection Status: 753985
Implantable Lead Connection Status: 753985
Implantable Lead Implant Date: 20230105
Implantable Lead Implant Date: 20230105
Implantable Lead Location: 753859
Implantable Lead Location: 753860
Implantable Lead Model: 5076
Implantable Lead Model: 5076
Implantable Pulse Generator Implant Date: 20230105
Lead Channel Impedance Value: 323 Ohm
Lead Channel Impedance Value: 361 Ohm
Lead Channel Impedance Value: 418 Ohm
Lead Channel Impedance Value: 513 Ohm
Lead Channel Pacing Threshold Amplitude: 0.375 V
Lead Channel Pacing Threshold Amplitude: 0.625 V
Lead Channel Pacing Threshold Pulse Width: 0.4 ms
Lead Channel Pacing Threshold Pulse Width: 0.4 ms
Lead Channel Sensing Intrinsic Amplitude: 3 mV
Lead Channel Sensing Intrinsic Amplitude: 3.125 mV
Lead Channel Sensing Intrinsic Amplitude: 3.25 mV
Lead Channel Sensing Intrinsic Amplitude: 4.5 mV
Lead Channel Setting Pacing Amplitude: 1.5 V
Lead Channel Setting Pacing Amplitude: 2 V
Lead Channel Setting Pacing Pulse Width: 0.4 ms
Lead Channel Setting Sensing Sensitivity: 0.9 mV
Zone Setting Status: 755011
Zone Setting Status: 755011

## 2024-03-31 NOTE — Patient Instructions (Signed)
 Medication Instructions:  Your physician recommends that you continue on your current medications as directed. Please refer to the Current Medication list given to you today.  *If you need a refill on your cardiac medications before your next appointment, please call your pharmacy*  Lab Work: None ordered If you have labs (blood work) drawn today and your tests are completely normal, you will receive your results only by: MyChart Message (if you have MyChart) OR A paper copy in the mail If you have any lab test that is abnormal or we need to change your treatment, we will call you to review the results.  Follow-Up: At [facility], you and your health needs are our priority.  As part of our continuing mission to provide you with exceptional heart care, our providers are all part of one team.  This team includes your primary Cardiologist (physician) and Advanced Practice Providers or APPs (Physician Assistants and Nurse Practitioners) who all work together to provide you with the care you need, when you need it.  Your next appointment:   1 year(s)  Provider:   Soyla Norton, MD or Daphne Barrack, NP

## 2024-06-11 ENCOUNTER — Ambulatory Visit: Admitting: Internal Medicine

## 2024-06-26 ENCOUNTER — Other Ambulatory Visit

## 2024-06-29 ENCOUNTER — Ambulatory Visit: Admitting: Pulmonary Disease

## 2024-07-09 ENCOUNTER — Other Ambulatory Visit

## 2025-01-26 ENCOUNTER — Other Ambulatory Visit (HOSPITAL_COMMUNITY)
